# Patient Record
Sex: Male | Born: 1985 | Race: Black or African American | Hispanic: No | Marital: Single | Smoking: Former smoker
Health system: Southern US, Community
[De-identification: ages and names within clinical notes are randomized; demographics above are authoritative.]

## PROBLEM LIST (undated history)

## (undated) DIAGNOSIS — L0291 Cutaneous abscess, unspecified: Secondary | ICD-10-CM

## (undated) DIAGNOSIS — G35 Multiple sclerosis: Secondary | ICD-10-CM

## (undated) DIAGNOSIS — M1990 Unspecified osteoarthritis, unspecified site: Secondary | ICD-10-CM

## (undated) DIAGNOSIS — I10 Essential (primary) hypertension: Secondary | ICD-10-CM

## (undated) DIAGNOSIS — K219 Gastro-esophageal reflux disease without esophagitis: Secondary | ICD-10-CM

## (undated) DIAGNOSIS — R519 Headache, unspecified: Secondary | ICD-10-CM

## (undated) HISTORY — PX: APPENDECTOMY: SHX54

---

## 2013-04-13 ENCOUNTER — Inpatient Hospital Stay (HOSPITAL_COMMUNITY)
Admission: EM | Admit: 2013-04-13 | Discharge: 2013-04-15 | DRG: 349 | Disposition: A | Discharge status: Home / Self Care | Payer: MEDICAID | Attending: General Surgery | Admitting: General Surgery

## 2013-04-13 ENCOUNTER — Encounter (HOSPITAL_COMMUNITY): Admission: EM | Disposition: A | Discharge status: Home / Self Care | Payer: Self-pay | Source: Home / Self Care

## 2013-04-13 ENCOUNTER — Encounter (HOSPITAL_COMMUNITY): Payer: Self-pay | Admitting: Anesthesiology

## 2013-04-13 ENCOUNTER — Encounter (HOSPITAL_COMMUNITY): Payer: Self-pay | Admitting: *Deleted

## 2013-04-13 ENCOUNTER — Inpatient Hospital Stay (HOSPITAL_COMMUNITY): Payer: Self-pay | Admitting: Anesthesiology

## 2013-04-13 DIAGNOSIS — Z87891 Personal history of nicotine dependence: Secondary | ICD-10-CM

## 2013-04-13 DIAGNOSIS — N433 Hydrocele, unspecified: Secondary | ICD-10-CM | POA: Diagnosis present

## 2013-04-13 DIAGNOSIS — K612 Anorectal abscess: Principal | ICD-10-CM | POA: Diagnosis present

## 2013-04-13 DIAGNOSIS — L0291 Cutaneous abscess, unspecified: Secondary | ICD-10-CM

## 2013-04-13 DIAGNOSIS — K611 Rectal abscess: Secondary | ICD-10-CM

## 2013-04-13 HISTORY — PX: INCISION AND DRAINAGE PERIRECTAL ABSCESS: SHX1804

## 2013-04-13 HISTORY — DX: Cutaneous abscess, unspecified: L02.91

## 2013-04-13 LAB — BASIC METABOLIC PANEL
CO2: 24 mEq/L (ref 19–32)
Chloride: 105 mEq/L (ref 96–112)
Creatinine, Ser: 0.9 mg/dL (ref 0.50–1.35)

## 2013-04-13 LAB — CBC WITH DIFFERENTIAL/PLATELET
Basophils Absolute: 0.1 10*3/uL (ref 0.0–0.1)
HCT: 41.1 % (ref 39.0–52.0)
Hemoglobin: 14.1 g/dL (ref 13.0–17.0)
Lymphocytes Relative: 27 % (ref 12–46)
Lymphs Abs: 4 10*3/uL (ref 0.7–4.0)
Monocytes Absolute: 1.1 10*3/uL — ABNORMAL HIGH (ref 0.1–1.0)
Monocytes Relative: 7 % (ref 3–12)
Neutro Abs: 9.2 10*3/uL — ABNORMAL HIGH (ref 1.7–7.7)
RBC: 4.81 MIL/uL (ref 4.22–5.81)
RDW: 13.6 % (ref 11.5–15.5)
WBC: 14.9 10*3/uL — ABNORMAL HIGH (ref 4.0–10.5)

## 2013-04-13 SURGERY — INCISION AND DRAINAGE, ABSCESS, PERIRECTAL
Anesthesia: General | Site: Perineum | Wound class: Dirty or Infected

## 2013-04-13 MED ORDER — KCL IN DEXTROSE-NACL 20-5-0.45 MEQ/L-%-% IV SOLN
INTRAVENOUS | Status: DC
  Administered 2013-04-13: 15:00:00 via INTRAVENOUS
  Filled 2013-04-13 (×3): qty 1000

## 2013-04-13 MED ORDER — PIPERACILLIN-TAZOBACTAM 3.375 G IVPB
3.3750 g | Freq: Three times a day (TID) | INTRAVENOUS | Status: DC
  Administered 2013-04-13 – 2013-04-15 (×5): 3.375 g via INTRAVENOUS
  Filled 2013-04-13 (×8): qty 50

## 2013-04-13 MED ORDER — PROPOFOL 10 MG/ML IV BOLUS
INTRAVENOUS | Status: DC | PRN
  Administered 2013-04-13: 200 mg via INTRAVENOUS

## 2013-04-13 MED ORDER — FENTANYL CITRATE 0.05 MG/ML IJ SOLN
INTRAMUSCULAR | Status: DC | PRN
  Administered 2013-04-13: 50 ug via INTRAVENOUS
  Administered 2013-04-13 (×2): 100 ug via INTRAVENOUS

## 2013-04-13 MED ORDER — MIDAZOLAM HCL 5 MG/5ML IJ SOLN
INTRAMUSCULAR | Status: DC | PRN
  Administered 2013-04-13: 2 mg via INTRAVENOUS

## 2013-04-13 MED ORDER — DIPHENHYDRAMINE HCL 50 MG/ML IJ SOLN: 12.5000 mg | Freq: Four times a day (QID) | INTRAMUSCULAR | Status: DC | PRN

## 2013-04-13 MED ORDER — ACETAMINOPHEN 650 MG RE SUPP: 650.0000 mg | Freq: Four times a day (QID) | RECTAL | Status: DC | PRN

## 2013-04-13 MED ORDER — DIPHENHYDRAMINE HCL 12.5 MG/5ML PO ELIX: 12.5000 mg | ORAL_SOLUTION | Freq: Four times a day (QID) | ORAL | Status: DC | PRN

## 2013-04-13 MED ORDER — MORPHINE SULFATE 2 MG/ML IJ SOLN
1.0000 mg | INTRAMUSCULAR | Status: DC | PRN
  Administered 2013-04-13 – 2013-04-14 (×3): 2 mg via INTRAVENOUS
  Filled 2013-04-13 (×3): qty 1

## 2013-04-13 MED ORDER — LACTATED RINGERS IV SOLN
INTRAVENOUS | Status: DC | PRN
  Administered 2013-04-13: 17:00:00 via INTRAVENOUS

## 2013-04-13 MED ORDER — HYDROMORPHONE HCL PF 1 MG/ML IJ SOLN: 0.2500 mg | INTRAMUSCULAR | Status: DC | PRN

## 2013-04-13 MED ORDER — ACETAMINOPHEN 325 MG PO TABS: 650.0000 mg | ORAL_TABLET | Freq: Four times a day (QID) | ORAL | Status: DC | PRN

## 2013-04-13 MED ORDER — ONDANSETRON HCL 4 MG/2ML IJ SOLN
4.0000 mg | Freq: Four times a day (QID) | INTRAMUSCULAR | Status: DC | PRN
  Administered 2013-04-14 – 2013-04-15 (×2): 4 mg via INTRAVENOUS
  Filled 2013-04-13 (×3): qty 2

## 2013-04-13 MED ORDER — LIDOCAINE HCL (CARDIAC) 20 MG/ML IV SOLN
INTRAVENOUS | Status: DC | PRN
  Administered 2013-04-13: 100 mg via INTRAVENOUS

## 2013-04-13 MED ORDER — BUPIVACAINE HCL (PF) 0.25 % IJ SOLN
INTRAMUSCULAR | Status: DC | PRN
  Administered 2013-04-13: 20 mL

## 2013-04-13 MED ORDER — OXYCODONE-ACETAMINOPHEN 5-325 MG PO TABS
1.0000 | ORAL_TABLET | ORAL | Status: DC | PRN
  Administered 2013-04-14 – 2013-04-15 (×3): 2 via ORAL
  Filled 2013-04-13 (×3): qty 2

## 2013-04-13 MED ORDER — LACTATED RINGERS IV SOLN
INTRAVENOUS | Status: DC
  Administered 2013-04-13: 15:00:00 via INTRAVENOUS
  Administered 2013-04-14: 10 mL/h via INTRAVENOUS

## 2013-04-13 MED ORDER — ONDANSETRON HCL 4 MG/2ML IJ SOLN
INTRAMUSCULAR | Status: DC | PRN
  Administered 2013-04-13: 4 mg via INTRAVENOUS

## 2013-04-13 SURGICAL SUPPLY — 33 items
BLADE SURG 15 STRL LF DISP TIS (BLADE) ×1 IMPLANT
BLADE SURG 15 STRL SS (BLADE) ×1
CANISTER SUCTION 2500CC (MISCELLANEOUS) ×2 IMPLANT
CLEANER TIP ELECTROSURG 2X2 (MISCELLANEOUS) IMPLANT
CLOTH BEACON ORANGE TIMEOUT ST (SAFETY) ×2 IMPLANT
COVER SURGICAL LIGHT HANDLE (MISCELLANEOUS) ×2 IMPLANT
DRAPE UTILITY 15X26 W/TAPE STR (DRAPE) ×4 IMPLANT
DRSG PAD ABDOMINAL 8X10 ST (GAUZE/BANDAGES/DRESSINGS) ×2 IMPLANT
ELECT REM PT RETURN 9FT ADLT (ELECTROSURGICAL) ×2
ELECTRODE REM PT RTRN 9FT ADLT (ELECTROSURGICAL) ×1 IMPLANT
GAUZE PACKING IODOFORM 1 (PACKING) IMPLANT
GAUZE PACKING IODOFORM 1/2 (PACKING) ×2 IMPLANT
GAUZE SPONGE 4X4 16PLY XRAY LF (GAUZE/BANDAGES/DRESSINGS) ×2 IMPLANT
GLOVE BIOGEL M STRL SZ7.5 (GLOVE) ×2 IMPLANT
GLOVE BIOGEL PI IND STRL 8 (GLOVE) ×2 IMPLANT
GLOVE BIOGEL PI INDICATOR 8 (GLOVE) ×2
GOWN STRL NON-REIN LRG LVL3 (GOWN DISPOSABLE) ×2 IMPLANT
GOWN STRL REIN XL XLG (GOWN DISPOSABLE) ×2 IMPLANT
KIT BASIN OR (CUSTOM PROCEDURE TRAY) ×2 IMPLANT
KIT ROOM TURNOVER OR (KITS) ×4 IMPLANT
NS IRRIG 1000ML POUR BTL (IV SOLUTION) ×2 IMPLANT
PACK LITHOTOMY IV (CUSTOM PROCEDURE TRAY) ×2 IMPLANT
PAD ARMBOARD 7.5X6 YLW CONV (MISCELLANEOUS) ×4 IMPLANT
PENCIL BUTTON HOLSTER BLD 10FT (ELECTRODE) ×2 IMPLANT
SPONGE GAUZE 4X4 12PLY (GAUZE/BANDAGES/DRESSINGS) ×2 IMPLANT
SWAB COLLECTION DEVICE MRSA (MISCELLANEOUS) ×2 IMPLANT
TOWEL OR 17X24 6PK STRL BLUE (TOWEL DISPOSABLE) ×2 IMPLANT
TOWEL OR 17X26 10 PK STRL BLUE (TOWEL DISPOSABLE) ×2 IMPLANT
TUBE ANAEROBIC SPECIMEN COL (MISCELLANEOUS) ×2 IMPLANT
TUBE CONNECTING 12X1/4 (SUCTIONS) ×2 IMPLANT
UNDERPAD 30X30 INCONTINENT (UNDERPADS AND DIAPERS) ×2 IMPLANT
WATER STERILE IRR 1000ML POUR (IV SOLUTION) ×2 IMPLANT
YANKAUER SUCT BULB TIP NO VENT (SUCTIONS) ×2 IMPLANT

## 2013-04-13 NOTE — H&P (Signed)
I saw the patient, participated in the history, exam and medical decision making, and concur with the physician assistant's note above.  Discussed risk & benefits of surgery with pt - bleeding, ongoing infection, need for addl procedures, blood clot formation, anesthesia complications, formation of fistula, urinary retention. Recurrence. Scarring.   Pt agrees with proceeding to OR Mary Sella. Andrey Campanile, MD, FACS General, Bariatric, & Minimally Invasive Surgery [facility], [place]

## 2013-04-13 NOTE — Anesthesia Preprocedure Evaluation (Signed)
Anesthesia Evaluation  Patient identified by MRN, date of birth, ID band Patient awake    Reviewed: Allergy & Precautions, H&P , NPO status , Patient's Chart, lab work & pertinent test results  Airway       Dental   Pulmonary neg pulmonary ROS,          Cardiovascular negative cardio ROS  Rhythm:Regular Rate:Normal     Neuro/Psych    GI/Hepatic Neg liver ROS,   Endo/Other    Renal/GU negative Renal ROS     Musculoskeletal   Abdominal   Peds  Hematology   Anesthesia Other Findings   Reproductive/Obstetrics                           Anesthesia Physical Anesthesia Plan  ASA: I  Anesthesia Plan: General   Post-op Pain Management:    Induction: Intravenous  Airway Management Planned: Oral ETT  Additional Equipment:   Intra-op Plan:   Post-operative Plan: Extubation in OR  Informed Consent: I have reviewed the patients History and Physical, chart, labs and discussed the procedure including the risks, benefits and alternatives for the proposed anesthesia with the patient or authorized representative who has indicated his/her understanding and acceptance.   Dental advisory given  Plan Discussed with: CRNA, Anesthesiologist and Surgeon  Anesthesia Plan Comments:         Anesthesia Quick Evaluation

## 2013-04-13 NOTE — Anesthesia Procedure Notes (Signed)
Procedure Name: LMA Insertion Date/Time: 04/13/2013 5:11 PM Performed by: Coralee Rud Pre-anesthesia Checklist: Patient identified, Emergency Drugs available and Suction available Patient Re-evaluated:Patient Re-evaluated prior to inductionOxygen Delivery Method: Circle system utilized Preoxygenation: Pre-oxygenation with 100% oxygen Intubation Type: IV induction Ventilation: Mask ventilation without difficulty LMA: LMA inserted LMA Size: 5.0 Number of attempts: 1 Placement Confirmation: positive ETCO2 Tube secured with: Tape

## 2013-04-13 NOTE — Op Note (Signed)
Pre Operative Diagnosis:  Perirectal abscess  Post Operative Diagnosis: same  Procedure: exam under anesthesia, I&D of left perirectal abscess  Surgeon: Dr. Axel Filler  Assistant: none  Anesthesia: GETA  EBL: 10 cc  Complications: none  Counts: reported as correct x 2  Findings:  Approximately 2 x 4 cm area of abscess. All loculations were broken up. Cultures were sent. The area was shallow and packed with half-inch gauze.  Indications for procedure:  The patient is a 27 year old male with a several day history of left perineal abscess. Patient was evaluated to have a perirectal abscess. Patient was consented for I&D of perirectal abscess drain placement.  Details of the procedure:The patient was taken back to the operating room. The patient was placed in lithotomy position with bilateral SCDs in place. After appropriate anitbiotics were confirmed, a time-out was confirmed and all facts were verified.  I stab incision was made of the area of greatest fluctuance. A large amount of purulence was expressed. Both anaerobic and aerobic cultures were sent. An elliptical incision was made which unroofed the abscess cavity. All loculations were broken up with blunt dissection. The area was then checked for hemostasis which was achieved using Bovie cautery. The area was irrigated out with sterile saline. At this point performed an exam under anesthesia there was no overt fistula that I could visualize. The abscess cavity was then packed with half-inch iodoform gauze. The area was injected with quarter percent Marcaine with epi. The wounds and dressed with 4 x 4's ABD pads and mesh panties.The patient was taken to the recovery room in stable condition.

## 2013-04-13 NOTE — ED Notes (Signed)
Pt states that he has an abscess behind scrotum. Pt states that he has hx of same. Pt state that he is normally able to use "some cream" and they go away.

## 2013-04-13 NOTE — ED Provider Notes (Signed)
History     CSN: [id number]  Arrival date & time 04/13/13  9562   First MD Initiated Contact with Patient 04/13/13 [phone]      Chief Complaint  Patient presents with  . Abscess    (Consider location/radiation/quality/duration/timing/severity/associated sxs/prior treatment) HPI Comments: Patient is a 27 year old male who presents with pain between his scrotum and rectum which he describes as sharp that has been gradually worsening for the past week. He first noticed the pain while jogging. It radiates up his groin. Worse with palpation. Sometimes he has pain with BMs. He has had prior abscesses before, but never in this area. Advil was helping, but the pain has gradually worsened and it is no longer helping. He was diagnosed with a cyst in his scrotum which was worked up for malignancy. No fevers, chills, urinary symptoms, IVDA.   Patient is a 27 y.o. male presenting with abscess. The history is provided by the patient. No language interpreter was used.  Abscess Associated symptoms: no fever, no nausea and no vomiting     History reviewed. No pertinent past medical history.  Past Surgical History  Procedure Laterality Date  . Appendectomy      No family history on file.  History  Substance Use Topics  . Smoking status: Never Smoker   . Smokeless tobacco: Not on file  . Alcohol Use: No      Review of Systems  Constitutional: Negative for fever and chills.  Gastrointestinal: Negative for nausea, vomiting and abdominal pain.  Genitourinary: Negative for dysuria, urgency, frequency and difficulty urinating.  Musculoskeletal: Positive for myalgias.  Skin: Positive for wound.  All other systems reviewed and are negative.    Allergies  Review of patient's allergies indicates no known allergies.  Home Medications  No current outpatient prescriptions on file.  BP 134/91  Pulse 104  Temp(Src) 98.1 F (36.7 C) (Oral)  Resp 16  SpO2 99%  Physical Exam  Nursing note  and vitals reviewed. Constitutional: He is oriented to person, place, and time. He appears well-developed and well-nourished. No distress.  HENT:  Head: Normocephalic and atraumatic.  Right Ear: External ear normal.  Left Ear: External ear normal.  Nose: Nose normal.  Eyes: Conjunctivae are normal.  Neck: Normal range of motion. No tracheal deviation present.  Cardiovascular: Normal rate, regular rhythm and normal heart sounds.   Pulmonary/Chest: Effort normal and breath sounds normal. No stridor.  Abdominal: Soft. He exhibits no distension. There is no tenderness.  Musculoskeletal: Normal range of motion.  Neurological: He is alert and oriented to person, place, and time.  Skin: Skin is warm and dry. He is not diaphoretic.  Area of induration with streaking or erythema between scrotum and rectum. US performed, small, deep abscess  Psychiatric: He has a normal mood and affect. His behavior is normal.    ED Course  Procedures (including critical care time)  Labs Reviewed  CBC WITH DIFFERENTIAL - Abnormal; Notable for the following:    WBC 14.9 (*)    Neutro Abs 9.2 (*)    Monocytes Absolute 1.1 (*)    All other components within normal limits  BASIC METABOLIC PANEL  BASIC METABOLIC PANEL  CBC   No results found.   1. Perirectal abscess       MDM  Patient is a 27 year old male who presents today with a perirectal abscess. Surgery was consulted and agreed to take him to OR to drain the abscess. Vital signs stable for transfer. Dr. Ignacia Palma  evaluated patient and agrees with plan. Discussed plan with patient who agrees.        Mora Bellman, PA-C 04/13/13 1311

## 2013-04-13 NOTE — Transfer of Care (Signed)
Immediate Anesthesia Transfer of Care Note  Patient: Devin Marsh  Procedure(s) Performed: Procedure(s): IRRIGATION AND DEBRIDEMENT PERIRECTAL ABSCESS (N/A)  Patient Location: PACU  Anesthesia Type:General  Level of Consciousness: awake, sedated and patient cooperative  Airway & Oxygen Therapy: Patient Spontanous Breathing and Patient connected to nasal cannula oxygen  Post-op Assessment: Report given to PACU RN, Post -op Vital signs reviewed and stable and Patient moving all extremities  Post vital signs: Reviewed and stable  Complications: No apparent anesthesia complications

## 2013-04-13 NOTE — H&P (Signed)
Devin Marsh is an 27 y.o. male.   Chief Complaint: Painful cyst below scrotum and rectum HPI: healthy 27 y/o male who noted an the above last week after jogging.  He says it has become progressively worse.  His girlfriend tried some ice without any success.  He presents to the ER now with pain below scrotum and left perirectal fluid collection.  History reviewed. No pertinent past medical history. Seasonal allergies.  Past Surgical History  Procedure Laterality Date  . Appendectomy      FH:  Both parents, 1 brother and 2 sisters in good health Social History:  Tobacco: smoked 3-4 year, quit at age 26  Drugs: none  ETOH: none Single works at [employer].   Allergies: No Known Allergies Prior to Admission medications   Medication Sig Start Date End Date Taking? Authorizing Provider  cetirizine (ZYRTEC) 10 MG tablet Take 10 mg by mouth daily as needed for allergies.   Yes Historical Provider, MD     (Not in a hospital admission)  No results found for this or any previous visit (from the past 48 hour(s)). No results found.  Review of Systems  Constitutional: Negative.   HENT: Positive for congestion (with allergies takes Zyrtec.).   Eyes: Negative.   Respiratory: Negative.   Cardiovascular: Negative.   Gastrointestinal: Positive for heartburn (some), constipation (occasional) and blood in stool (occasional with with BM). Negative for nausea, vomiting and abdominal pain.  Genitourinary: Negative.        He has a small right hydrocele seen in [facility] for this in the past.  Musculoskeletal: Negative.   Skin: Negative for itching and rash.       Some zits like changes in the past none currently  Neurological: Negative.   Endo/Heme/Allergies: Negative.   Psychiatric/Behavioral: Negative.     Blood pressure 134/91, pulse 104, temperature 98.1 F (36.7 C), temperature source Oral, resp. rate 16, SpO2 99.00%. Physical Exam  Constitutional: He is  oriented to person, place, and time. He appears well-developed and well-nourished. No distress.  HENT:  Head: Normocephalic and atraumatic.  Nose: Nose normal.  Eyes: Conjunctivae and EOM are normal. Pupils are equal, round, and reactive to light. Right eye exhibits no discharge. Left eye exhibits no discharge. No scleral icterus.  Neck: Normal range of motion. Neck supple. No JVD present. No tracheal deviation present. No thyromegaly present.  Cardiovascular: Normal rate, regular rhythm and intact distal pulses.   Murmur heard. Respiratory: Effort normal and breath sounds normal. No respiratory distress. He has no wheezes. He has no rales. He exhibits no tenderness.  GI: Soft. Bowel sounds are normal. He exhibits no distension and no mass. There is no tenderness. There is no rebound and no guarding.  Genitourinary: Penis normal.  6 cm left gluteal area that is tender and fluctulent. Not really red. Small right hydrocele.  Musculoskeletal: He exhibits no edema and no tenderness.  Lymphadenopathy:    He has no cervical adenopathy.  Neurological: He is alert and oriented to person, place, and time. No cranial nerve deficit.  Skin: Skin is warm and dry. No rash noted. He is not diaphoretic. No erythema. No pallor.  Psychiatric: He has a normal mood and affect. His behavior is normal. Judgment and thought content normal.     Assessment/Plan 1. Perirectal abscess left buttocks 2. Hydrocele.  Plan:  Labs WBC 14.9.  We plan to take to the OR later this afternoon.  Starting IV fluids and Zosyn. Sherrie George  04/13/2013, 11:12 AM

## 2013-04-14 ENCOUNTER — Encounter (HOSPITAL_COMMUNITY): Payer: Self-pay | Admitting: General Surgery

## 2013-04-14 MED ORDER — ENOXAPARIN SODIUM 40 MG/0.4ML ~~LOC~~ SOLN
40.0000 mg | SUBCUTANEOUS | Status: DC
  Administered 2013-04-14: 40 mg via SUBCUTANEOUS
  Filled 2013-04-14: qty 0.4

## 2013-04-14 NOTE — Progress Notes (Signed)
Discussed with PA  Manami Tutor M. Tequisha Maahs, MD, FACS General, Bariatric, & Minimally Invasive Surgery [facility] [place]  

## 2013-04-14 NOTE — Progress Notes (Signed)
1 Day Post-Op  Subjective: Pt doing okay, not much pain.  Has felt chills and sweats overnight, now nauseated.  No vomiting.  Poor appetite, significant other brought hardes, suggested something a bit lighter/BRAT diet.  He is ambulating well.  No difficulties with voiding.  No abdominal pain, +flatus, no bm since prior to admission.  Denies shortness of breath or chest pains.    Objective: Vital signs in last 24 hours: Temp:  [97.5 F (36.4 C)-98.6 F (37 C)] 98.2 F (36.8 C) (06/17 0525) Pulse Rate:  [71-83] 77 (06/17 0525) Resp:  [13-23] 18 (06/17 0525) BP: (107-134)/(68-90) 107/69 mmHg (06/17 0525) SpO2:  [95 %-100 %] 100 % (06/17 0525) FiO2 (%):  [28 %] 28 % (06/16 1809)    Intake/Output from previous day: 06/16 0701 - 06/17 0700 In: 800 [I.V.:800] Out: 600 [Urine:600] Intake/Output this shift:    General appearance: alert, cooperative, appears stated age and no distress Resp: clear to auscultation bilaterally Cardio: regular rate and rhythm, S1, S2 normal, no murmur, click, rub or gallop GI: soft, non-tender; bowel sounds normal; no masses,  no organomegaly  Lab Results:   Recent Labs  04/13/13 1135  WBC 14.9*  HGB 14.1  HCT 41.1  PLT 292   BMET  Recent Labs  04/13/13 1135  NA 140  K 3.9  CL 105  CO2 24  GLUCOSE 88  BUN 11  CREATININE 0.90  CALCIUM 9.1    Studies/Results: No results found.  Anti-infectives: Anti-infectives   Start     Dose/Rate Route Frequency Ordered Stop   04/13/13 1400  piperacillin-tazobactam (ZOSYN) IVPB 3.375 g     3.375 g 12.5 mL/hr over 240 Minutes Intravenous 3 times per day 04/13/13 1219        Assessment/Plan: s/p Procedure(s): IRRIGATION AND DEBRIDEMENT PERIRECTAL ABSCESS (N/A) Dr. Derrell Lolling 04/13/13 Perirectal abscess -Culture is pending -BID wet to dry dressing changes.  Will premedicate patient today and change dressing.  Hopefully discharge tomorrow with home health.  His significant other is a nurse and will  be able to do the dressings. -zosyn  VTE prophylaxis  -add lovenox, ambulate and SCDs    LOS: 1 day    Jocelin Schuelke ANP-BC  04/14/2013 9:56 AM

## 2013-04-14 NOTE — Anesthesia Postprocedure Evaluation (Signed)
  Anesthesia Post-op Note  Patient: Devin Marsh  Procedure(s) Performed: Procedure(s): IRRIGATION AND DEBRIDEMENT PERIRECTAL ABSCESS (N/A)  Patient Location: PACU  Anesthesia Type:General  Level of Consciousness: awake, alert , oriented and patient cooperative  Airway and Oxygen Therapy: Patient Spontanous Breathing  Post-op Pain: mild  Post-op Assessment: Post-op Vital signs reviewed, Patient's Cardiovascular Status Stable, Respiratory Function Stable, Patent Airway, No signs of Nausea or vomiting and Pain level controlled  Post-op Vital Signs: stable  Complications: No apparent anesthesia complications

## 2013-04-14 NOTE — ED Provider Notes (Signed)
Medical screening examination/treatment/procedure(s) were performed by non-physician practitioner and as supervising physician I was immediately available for consultation/collaboration. Pt has pain in left perineum just anterior to rectum.  Screening ultrasound shows deep fluid collection.  Recommended general surgical evaluation for perirectal abscess.  Carleene Cooper III, MD 04/14/13 [phone]

## 2013-04-15 ENCOUNTER — Telehealth (INDEPENDENT_AMBULATORY_CARE_PROVIDER_SITE_OTHER): Payer: Self-pay | Admitting: *Deleted

## 2013-04-15 MED ORDER — OXYCODONE-ACETAMINOPHEN 5-325 MG PO TABS: 1.0000 | ORAL_TABLET | Freq: Four times a day (QID) | ORAL | Status: DC | PRN

## 2013-04-15 MED ORDER — AMOXICILLIN-POT CLAVULANATE 875-125 MG PO TABS: 1.0000 | ORAL_TABLET | Freq: Two times a day (BID) | ORAL | Status: DC

## 2013-04-15 MED ORDER — ACETAMINOPHEN 325 MG PO TABS: 650.0000 mg | ORAL_TABLET | Freq: Four times a day (QID) | ORAL | Status: DC | PRN

## 2013-04-15 NOTE — Discharge Summary (Signed)
Physician Discharge Summary  Siyon Linck YNW:[id number] DOB: 1986-04-17 DOA: 04/13/2013  Consultation: none  Admit date: 04/13/2013 Discharge date: 04/15/2013  Recommendations for Outpatient Follow-up:    Follow-up Information   Follow up with Lajean Saver, MD. Schedule an appointment as soon as possible for a visit in 2 weeks. (please call as soon as possible to schedule a follow up appt with your surgeon.)    Contact information:   [address] [place] [address] [phone]      Discharge Diagnoses:  1. Perirectal abscess   Surgical Procedure: IRRIGATION AND DEBRIDEMENT PERIRECTAL ABSCESS(Dr. Derrell Lolling 04/13/13)  Discharge Condition: stable Disposition: home  Diet recommendation: regular, high fiber  Filed Weights   04/14/13 1056  Weight: 227 lb 3.2 oz (103.057 kg)       Hospital Course:  Devin Marsh is a healthy male who presented with a perirectal abscess.  He underwent an incision and debridement.  He had an uneventful post operative stay.  He was treated with IV antibiotics.  His vital signs remained stable along with laboratory testing.  On POD #2 his pain was under good control, he was ambulating and tolerating regular diet.  He was therefore felt safe for discharge. I offered home health for dressing changes, however, he does not have health insurance.  His girlfriend is a Engineer, civil (consulting) and is able to do the BID dressing changes, nurses teaching completed.  He was given a prescription for an additional week of antibiotics along with pain medication.  We discussed side effects of the medication and signs and symptoms that warrant immediate attention  General appearance: alert and oriented. Calm and cooperative No acute distress. VSS. Afebrile.  Resp: clear to auscultation bilaterally  Cardio: S1S1 RRR without murmurs or gallops. No edema. GI: soft round and nontender. +BS x4 quadrants. No organomegaly, hernias or masses. Perirectal dressing is dry and  intact. Pulses: +2 bilateral distal pulses without cyanosis   Discharge Instructions     Medication List    TAKE these medications       acetaminophen 325 MG tablet  Commonly known as:  TYLENOL  Take 2 tablets (650 mg total) by mouth every 6 (six) hours as needed.     amoxicillin-clavulanate 875-125 MG per tablet  Commonly known as:  AUGMENTIN  Take 1 tablet by mouth 2 (two) times daily.     cetirizine 10 MG tablet  Commonly known as:  ZYRTEC  Take 10 mg by mouth daily as needed for allergies.     oxyCODONE-acetaminophen 5-325 MG per tablet  Commonly known as:  PERCOCET/ROXICET  Take 1 tablet by mouth every 6 (six) hours as needed.           Follow-up Information   Follow up with Lajean Saver, MD. Schedule an appointment as soon as possible for a visit in 2 weeks. (please call as soon as possible to schedule a follow up appt with your surgeon.)    Contact information:   [address] [place] [address] [phone]        The results of significant diagnostics from this hospitalization (including imaging, microbiology, ancillary and laboratory) are listed below for reference.    Significant Diagnostic Studies: No results found.  Microbiology: Recent Results (from the past 240 hour(s))  ANAEROBIC CULTURE     Status: None   Collection Time    04/13/13  5:39 PM      Result Value Range Status   Specimen Description ABSCESS ANAL  Final   Special Requests NONE   Final   Gram Stain     Final   Value: ABUNDANT WBC PRESENT, PREDOMINANTLY PMN     NO SQUAMOUS EPITHELIAL CELLS SEEN     ABUNDANT GRAM POSITIVE COCCI     IN PAIRS IN CLUSTERS   Culture     Final   Value: NO ANAEROBES ISOLATED; CULTURE IN PROGRESS FOR 5 DAYS   Report Status PENDING   Incomplete  CULTURE, ROUTINE-ABSCESS     Status: None   Collection Time    04/13/13  5:39 PM      Result Value Range Status   Specimen Description ABSCESS ANAL   Final   Special Requests NONE   Final    Gram Stain     Final   Value: ANAEROBES     WBC PRESENT, PREDOMINANTLY PMN     NO SQUAMOUS EPITHELIAL CELLS SEEN     MODERATE GRAM POSITIVE COCCI   Culture NO GROWTH 2 DAYS   Final   Report Status PENDING   Incomplete     Labs: Basic Metabolic Panel:  Recent Labs Lab 04/13/13 1135  NA 140  K 3.9  CL 105  CO2 24  GLUCOSE 88  BUN 11  CREATININE 0.90  CALCIUM 9.1   CBC:  Recent Labs Lab 04/13/13 1135  WBC 14.9*  NEUTROABS 9.2*  HGB 14.1  HCT 41.1  MCV 85.4  PLT 292    Active Problems:   * No active hospital problems. *   Time coordinating discharge: 30 mins  Signed:  Waddell Iten, ANP-BC

## 2013-04-15 NOTE — Telephone Encounter (Signed)
Patient called to state increase in bleeding from the site.  Patient states he has been getting up a lot more and moving around.  It sounds as though there is increased swelling to the site.  Patient encouraged to use some ice to help with swelling and bleeding.  Also encouraged patient to take some rest periods to decrease bleeding due to gravity.  Patient states understanding and will call tomorrow if bleeding has not improved.

## 2013-04-17 LAB — CULTURE, ROUTINE-ABSCESS

## 2013-04-18 LAB — ANAEROBIC CULTURE

## 2013-04-20 NOTE — Discharge Summary (Signed)
Fey Coghill M. Christianne Zacher, MD, FACS General, Bariatric, & Minimally Invasive Surgery [facility]  

## 2013-05-06 ENCOUNTER — Ambulatory Visit (INDEPENDENT_AMBULATORY_CARE_PROVIDER_SITE_OTHER): Payer: Self-pay | Admitting: General Surgery

## 2013-05-06 ENCOUNTER — Encounter (INDEPENDENT_AMBULATORY_CARE_PROVIDER_SITE_OTHER): Payer: Self-pay | Admitting: General Surgery

## 2013-05-06 VITALS — BP 112/72 | HR 72 | Temp 98.2°F | Resp 16 | Ht 70.0 in | Wt 236.0 lb

## 2013-05-06 DIAGNOSIS — L03317 Cellulitis of buttock: Secondary | ICD-10-CM

## 2013-05-06 DIAGNOSIS — L0231 Cutaneous abscess of buttock: Secondary | ICD-10-CM

## 2013-05-06 NOTE — Progress Notes (Signed)
Patient ID: Devin Marsh, male   DOB: 1986/10/12, 27 y.o.   MRN: [id number] The patient is a 27 year old male status post left gluteal I&D. Patient has been doing well postoperatively and after being sent home from the hospital.antibiotics have been completed. Wound dressing changes have been going well without any issues.  On exam: Flat beefy-red wound no drainage no erythema  Assessment and plan: 27 year old male status post I&D of left abscess. 1. Patient return to clinic when necessary 2.  No further dressing changes needed

## 2015-02-28 ENCOUNTER — Encounter (HOSPITAL_COMMUNITY): Payer: Self-pay | Admitting: *Deleted

## 2015-02-28 ENCOUNTER — Emergency Department (HOSPITAL_COMMUNITY)
Admission: EM | Admit: 2015-02-28 | Discharge: 2015-02-28 | Disposition: A | Discharge status: Home / Self Care | Payer: Self-pay | Attending: Emergency Medicine | Admitting: Emergency Medicine

## 2015-02-28 ENCOUNTER — Emergency Department (HOSPITAL_COMMUNITY): Payer: Self-pay

## 2015-02-28 DIAGNOSIS — Z872 Personal history of diseases of the skin and subcutaneous tissue: Secondary | ICD-10-CM | POA: Insufficient documentation

## 2015-02-28 DIAGNOSIS — Z720 Tobacco use: Secondary | ICD-10-CM | POA: Insufficient documentation

## 2015-02-28 DIAGNOSIS — R0789 Other chest pain: Secondary | ICD-10-CM | POA: Insufficient documentation

## 2015-02-28 DIAGNOSIS — E669 Obesity, unspecified: Secondary | ICD-10-CM | POA: Insufficient documentation

## 2015-02-28 DIAGNOSIS — Z79899 Other long term (current) drug therapy: Secondary | ICD-10-CM | POA: Insufficient documentation

## 2015-02-28 LAB — COMPREHENSIVE METABOLIC PANEL
ALT: 41 U/L (ref 17–63)
ANION GAP: 10 (ref 5–15)
AST: 29 U/L (ref 15–41)
Albumin: 4 g/dL (ref 3.5–5.0)
Alkaline Phosphatase: 81 U/L (ref 38–126)
BILIRUBIN TOTAL: 0.5 mg/dL (ref 0.3–1.2)
BUN: 9 mg/dL (ref 6–20)
CO2: 25 mmol/L (ref 22–32)
CREATININE: 1.05 mg/dL (ref 0.61–1.24)
Calcium: 9.3 mg/dL (ref 8.9–10.3)
Chloride: 107 mmol/L (ref 101–111)
GFR calc Af Amer: >60 mL/min (ref >60)
GFR calc non Af Amer: >60 mL/min (ref >60)
Glucose, Bld: 127 mg/dL — ABNORMAL HIGH (ref 70–99)
POTASSIUM: 3.8 mmol/L (ref 3.5–5.1)
Sodium: 142 mmol/L (ref 135–145)
TOTAL PROTEIN: 7.1 g/dL (ref 6.5–8.1)

## 2015-02-28 LAB — CBC WITH DIFFERENTIAL/PLATELET
BASOS PCT: 1 % (ref 0–1)
Basophils Absolute: 0.1 10*3/uL (ref 0.0–0.1)
EOS PCT: 5 % (ref 0–5)
Eosinophils Absolute: 0.4 10*3/uL (ref 0.0–0.7)
HCT: 41.2 % (ref 39.0–52.0)
HEMOGLOBIN: 13.2 g/dL (ref 13.0–17.0)
LYMPHS ABS: 4.1 10*3/uL — AB (ref 0.7–4.0)
Lymphocytes Relative: 43 % (ref 12–46)
MCH: 27.6 pg (ref 26.0–34.0)
MCHC: 32 g/dL (ref 30.0–36.0)
MCV: 86 fL (ref 78.0–100.0)
Monocytes Absolute: 0.4 10*3/uL (ref 0.1–1.0)
Monocytes Relative: 4 % (ref 3–12)
NEUTROS ABS: 4.5 10*3/uL (ref 1.7–7.7)
Neutrophils Relative %: 47 % (ref 43–77)
Platelets: 273 10*3/uL (ref 150–400)
RBC: 4.79 MIL/uL (ref 4.22–5.81)
RDW: 14 % (ref 11.5–15.5)
WBC: 9.5 10*3/uL (ref 4.0–10.5)

## 2015-02-28 MED ORDER — OXYCODONE-ACETAMINOPHEN 5-325 MG PO TABS
1.0000 | ORAL_TABLET | ORAL | Status: DC | PRN
Start: 2015-02-28 — End: 2015-05-12

## 2015-02-28 NOTE — ED Provider Notes (Signed)
CSN: [id number]     Arrival date & time 02/28/15  1217 History   First MD Initiated Contact with Patient 02/28/15 1634     Chief Complaint  Patient presents with  . Chest Pain    rib pain  . Cough     (Consider location/radiation/quality/duration/timing/severity/associated sxs/prior Treatment) HPI  this is a 29 year old male who presents today complaining of left lateral chest pain that is worse with movement and coughing. He states it began yesterday with coughing. He has had seasonal allergy symptoms. He denies wheezing or fever. He states that his girlfriend is a nurse that he coughed up some dark material that she thought might be blood he came in secondary to this area he has taken Tylenol and ibuprofen without relief. He is a current smoker although he did quit for 3 years recently. He denies any history of DVT, DVT risk factors, or symptoms such as leg swelling. He describes the pain as sharp and severe. It is nonradiating in nature. He denies any similar previous symptoms.  Past Medical History  Diagnosis Date  . Abscess 04/13/2013   Past Surgical History  Procedure Laterality Date  . Appendectomy    . Incision and drainage perirectal abscess N/A 04/13/2013    Procedure: IRRIGATION AND DEBRIDEMENT PERIRECTAL ABSCESS;  Surgeon: [name], MD;  Location: [place];  Service: General;  Laterality: N/A;   No family history on file. History  Substance Use Topics  . Smoking status: Current Every Day Smoker    Last Attempt to Quit: 10/29/2008  . Smokeless tobacco: Never Used  . Alcohol Use: No    Review of Systems  All other systems reviewed and are negative.     Allergies  Review of patient's allergies indicates no known allergies.  Home Medications   Prior to Admission medications   Medication Sig Start Date End Date Taking? Authorizing Provider  cetirizine (ZYRTEC) 10 MG tablet Take 10 mg by mouth daily as needed for allergies.    Historical Provider, MD   BP 127/94  mmHg  Pulse 88  Temp(Src) 98.5 F (36.9 C) (Oral)  Resp 16  Ht 5\' 11"  (1.803 m)  Wt 260 lb (117.935 kg)  BMI 36.28 kg/m2  SpO2 98% Physical Exam  Constitutional: He is oriented to person, place, and time. He appears well-developed and well-nourished.  Obese  HENT:  Head: Normocephalic and atraumatic.  Right Ear: External ear normal.  Left Ear: External ear normal.  Nose: Nose normal.  Mouth/Throat: Oropharynx is clear and moist.  Eyes: Conjunctivae and EOM are normal. Pupils are equal, round, and reactive to light.  Neck: Normal range of motion. Neck supple.  Cardiovascular: Normal rate, regular rhythm, normal heart sounds and intact distal pulses.   Pulmonary/Chest: Effort normal and breath sounds normal. No respiratory distress. He has no wheezes.   He exhibits tenderness.  Abdominal: Soft. Bowel sounds are normal. He exhibits no distension and no mass. There is no tenderness. There is no guarding.  Musculoskeletal: Normal range of motion.  Neurological: He is alert and oriented to person, place, and time. He has normal reflexes. He exhibits normal muscle tone. Coordination normal.  Skin: Skin is warm and dry.  Psychiatric: He has a normal mood and affect. His behavior is normal. Judgment and thought content normal.  Nursing note and vitals reviewed.   ED Course  Procedures (including critical care time) Labs Review Labs Reviewed  CBC WITH DIFFERENTIAL/PLATELET - Abnormal; Notable for the following:    Lymphs  Abs 4.1 (*)    All other components within normal limits  COMPREHENSIVE METABOLIC PANEL - Abnormal; Notable for the following:    Glucose, Bld 127 (*)    All other components within normal limits    Imaging Review Dg Ribs Unilateral W/chest Left  02/28/2015   CLINICAL DATA:  Left-sided rib pain stated last night.  Hemoptysis.  EXAM: LEFT RIBS AND CHEST - 3+ VIEW  COMPARISON:  None.  FINDINGS: Frontal view of the chest and four views of left-sided ribs. Midline  trachea. Heart size accentuated by low lung volumes on the frontal film. No pleural effusion or pneumothorax. Biapical pleural thickening. Clear lungs.  Left-sided rib films demonstrate no displaced fracture. Radiographic marker projects over the eleventh and twelfth lateral left ribs.  IMPRESSION: No displaced rib fracture, pleural fluid, or pneumothorax.   Electronically Signed   By: Abigail Miyamoto M.D.   On: 02/28/2015 14:07     EKG Interpretation   Date/Time:  Monday Feb 28 2015 13:06:45 EDT Ventricular Rate:  89 PR Interval:  164 QRS Duration: 82 QT Interval:  342 QTC Calculation: 416 R Axis:   26 Text Interpretation:  Normal sinus rhythm Confirmed by Dulse Rutan MD, Andee Poles  [phone]) on 02/28/2015 5:02:06 PM      MDM   Final diagnoses:  Left-sided chest wall pain    29 year old male with reproducible left chest pain without dyspnea or fever. I have a low index of suspicion for coronary artery disease or pulmonary embolism. The pain is very reproducible and there is point tenderness. I have discussed these other conditions with the patient and discussed return precautions such as dyspnea change in type pain. He has asked for a work note and I will give him a work note to go back on Wednesday. He will be given a prescription for 10 Percocet.    Pattricia Boss, MD 03/02/15 [phone]

## 2015-02-28 NOTE — ED Notes (Signed)
Pt denies pain at this time. States that it only hurts when coughing.

## 2015-02-28 NOTE — ED Notes (Signed)
Pt states pain to L rib cage since Sunday.  Pain increases with palpation, movement and breathing.  Denies sob, but c/o occasional productive cough.

## 2015-02-28 NOTE — Discharge Instructions (Signed)
Chest Wall Pain °Chest wall pain is pain felt in or around the chest bones and muscles. It may take up to 6 weeks to get better. It may take longer if you are active. Chest wall pain can happen on its own. Other times, things like germs, injury, coughing, or exercise can cause the pain. °HOME CARE  °· Avoid activities that make you tired or cause pain. Try not to use your chest, belly (abdominal), or side muscles. Do not use heavy weights. °· Put ice on the sore area. °¨ Put ice in a plastic bag. °¨ Place a towel between your skin and the bag. °¨ Leave the ice on for 15-20 minutes for the first 2 days. °· Only take medicine as told by your doctor. °GET HELP RIGHT AWAY IF:  °· You have more pain or are very uncomfortable. °· You have a fever. °· Your chest pain gets worse. °· You have new problems. °· You feel sick to your stomach (nauseous) or throw up (vomit). °· You start to sweat or feel lightheaded. °· You have a cough with mucus (phlegm). °· You cough up blood. °MAKE SURE YOU:  °· Understand these instructions. °· Will watch your condition. °· Will get help right away if you are not doing well or get worse. °Document Released: 04/02/2008 Document Revised: 01/07/2012 Document Reviewed: 06/11/2011 °[employer]® Patient Information ©2015 [employer]. This information is not intended to replace advice given to you by your health care provider. Make sure you discuss any questions you have with your health care provider. ° °

## 2015-05-11 ENCOUNTER — Observation Stay (HOSPITAL_COMMUNITY)
Admission: EM | Admit: 2015-05-11 | Discharge: 2015-05-12 | Disposition: A | Discharge status: Home / Self Care | Payer: Self-pay | Attending: Internal Medicine | Admitting: Internal Medicine

## 2015-05-11 ENCOUNTER — Emergency Department (HOSPITAL_COMMUNITY)
Admission: EM | Admit: 2015-05-11 | Discharge: 2015-05-11 | Discharge status: Against Medical Advice | Payer: Self-pay | Attending: Emergency Medicine | Admitting: Emergency Medicine

## 2015-05-11 ENCOUNTER — Encounter (HOSPITAL_COMMUNITY): Payer: Self-pay | Admitting: Emergency Medicine

## 2015-05-11 ENCOUNTER — Encounter (HOSPITAL_COMMUNITY): Payer: Self-pay | Admitting: *Deleted

## 2015-05-11 DIAGNOSIS — M6282 Rhabdomyolysis: Principal | ICD-10-CM | POA: Insufficient documentation

## 2015-05-11 DIAGNOSIS — R252 Cramp and spasm: Secondary | ICD-10-CM | POA: Insufficient documentation

## 2015-05-11 DIAGNOSIS — Z720 Tobacco use: Secondary | ICD-10-CM | POA: Insufficient documentation

## 2015-05-11 DIAGNOSIS — Z872 Personal history of diseases of the skin and subcutaneous tissue: Secondary | ICD-10-CM | POA: Insufficient documentation

## 2015-05-11 DIAGNOSIS — Z79899 Other long term (current) drug therapy: Secondary | ICD-10-CM | POA: Insufficient documentation

## 2015-05-11 LAB — BASIC METABOLIC PANEL
ANION GAP: 10 (ref 5–15)
BUN: 9 mg/dL (ref 6–20)
CALCIUM: 9.7 mg/dL (ref 8.9–10.3)
CO2: 23 mmol/L (ref 22–32)
Chloride: 105 mmol/L (ref 101–111)
Creatinine, Ser: 1 mg/dL (ref 0.61–1.24)
Glucose, Bld: 94 mg/dL (ref 65–99)
Potassium: 3.8 mmol/L (ref 3.5–5.1)
Sodium: 138 mmol/L (ref 135–145)

## 2015-05-11 LAB — CBC WITH DIFFERENTIAL/PLATELET
BASOS PCT: 0 % (ref 0–1)
Basophils Absolute: 0 10*3/uL (ref 0.0–0.1)
EOS PCT: 4 % (ref 0–5)
Eosinophils Absolute: 0.4 10*3/uL (ref 0.0–0.7)
HCT: 43.2 % (ref 39.0–52.0)
Hemoglobin: 14.6 g/dL (ref 13.0–17.0)
LYMPHS PCT: 36 % (ref 12–46)
Lymphs Abs: 3.9 10*3/uL (ref 0.7–4.0)
MCH: 28.7 pg (ref 26.0–34.0)
MCHC: 33.8 g/dL (ref 30.0–36.0)
MCV: 85 fL (ref 78.0–100.0)
Monocytes Absolute: 0.6 10*3/uL (ref 0.1–1.0)
Monocytes Relative: 6 % (ref 3–12)
NEUTROS ABS: 5.8 10*3/uL (ref 1.7–7.7)
Neutrophils Relative %: 54 % (ref 43–77)
PLATELETS: 290 10*3/uL (ref 150–400)
RBC: 5.08 MIL/uL (ref 4.22–5.81)
RDW: 14.4 % (ref 11.5–15.5)
WBC: 10.7 10*3/uL — AB (ref 4.0–10.5)

## 2015-05-11 LAB — CK: Total CK: 1587 U/L — ABNORMAL HIGH (ref 49–397)

## 2015-05-11 MED ORDER — SODIUM CHLORIDE 0.9 % IV BOLUS (SEPSIS)
1000.0000 mL | Freq: Once | INTRAVENOUS | Status: AC
  Administered 2015-05-12: 1000 mL via INTRAVENOUS

## 2015-05-11 MED ORDER — MORPHINE SULFATE 4 MG/ML IJ SOLN
4.0000 mg | Freq: Once | INTRAMUSCULAR | Status: AC
  Administered 2015-05-12: 4 mg via INTRAVENOUS
  Filled 2015-05-11: qty 1

## 2015-05-11 MED ORDER — SODIUM CHLORIDE 0.9 % IV BOLUS (SEPSIS)
1000.0000 mL | Freq: Once | INTRAVENOUS | Status: AC
  Administered 2015-05-11: 1000 mL via INTRAVENOUS

## 2015-05-11 NOTE — ED Notes (Signed)
Pt reports taking pain meds at home with no relief, states that he doesn't want them at triage since they havent helped.

## 2015-05-11 NOTE — ED Notes (Signed)
Pt states he gets cramps throughout in entire body, states yesterday in shower his entire body locked up. Denies SOB, n/v. States girlfriend states he may have a blood clot.

## 2015-05-11 NOTE — H&P (Signed)
[facility] History and Physical  Devin Marsh ERD:[id number] DOB: 27-Feb-1986 DOA: 05/11/2015  Referring physician: ED physician PCP: No PCP Per Patient  Specialists:   Chief Complaint: Muscle cramps and pain  HPI: Devin Marsh is a 29 y.o. male with PMH of abscess, tobacco abuse, who presents with muscle cramps and pain.  Patient reports that easily he has been doing exercise in gym (weight lifting), taking niacin and muscle supplements from [facility] ([employer]), and also does repetitive work in a Restaurant manager, fast food. He states boxes are not generally heavy. He does sweat a great deal but is in an air-conditioned facility. Today he started having muscle cramps and pain all over his body. He states that he has completely cut protein out of his diet (has mostly been eating fruit).    Currently patient denies fever, chills, running nose, ear pain, headaches, cough, chest pain, SOB, abdominal pain, diarrhea, constipation, dysuria, urgency, frequency, hematuria, skin rashes or leg swelling. No unilateral weakness, numbness or tingling sensations. No vision change or hearing loss.  In ED, patient was found to have elevated CK 1587, WBC 10.7, temperature normal, tachycardia, renal function normal. Patient is admitted to inpatient for observation.  Where does patient live?   At home   Can patient participate in ADLs?  Yes      Review of Systems:   General: no fevers, chills, no changes in body weight, has fatigue HEENT: no blurry vision, hearing changes or sore throat Pulm: no dyspnea, coughing, wheezing CV: no chest pain, palpitations Abd: no nausea, vomiting, abdominal pain, diarrhea, constipation GU: no dysuria, burning on urination, increased urinary frequency, hematuria  Ext: no leg edema Neuro: no unilateral weakness, numbness, or tingling, no vision change or hearing loss Skin: no rash MSK: has muscle pain. no deformity, no limitation of range of movement in  spin Heme: No easy bruising.  Travel history: No recent long distant travel.  Allergy: No Known Allergies  Past Medical History  Diagnosis Date  . Abscess 04/13/2013    Past Surgical History  Procedure Laterality Date  . Appendectomy    . Incision and drainage perirectal abscess N/A 04/13/2013    Procedure: IRRIGATION AND DEBRIDEMENT PERIRECTAL ABSCESS;  Surgeon: [name], MD;  Location: [place];  Service: General;  Laterality: N/A;    Social History:  reports that he has been smoking.  He has never used smokeless tobacco. He reports that he does not drink alcohol or use illicit drugs.  Family History:  Family History  Problem Relation Age of Onset  . Diabetes Mother      Prior to Admission medications   Medication Sig Start Date End Date Taking? Authorizing Provider  acetaminophen (TYLENOL) 500 MG tablet Take 500 mg by mouth every 6 (six) hours as needed.    Historical Provider, MD  ibuprofen (ADVIL,MOTRIN) 200 MG tablet Take 200 mg by mouth every 6 (six) hours as needed.    Historical Provider, MD  naproxen sodium (ALEVE) 220 MG tablet Take 220 mg by mouth 2 (two) times daily as needed (for pain).    Historical Provider, MD  niacin 500 MG tablet Take 500 mg by mouth every morning.    Historical Provider, MD  oxyCODONE-acetaminophen (PERCOCET/ROXICET) 5-325 MG per tablet Take 1-2 tablets by mouth every 4 (four) hours as needed for severe pain. 02/28/15   Pattricia Boss, MD    Physical Exam: Filed Vitals:   05/11/15 2117 05/11/15 2338 05/12/15 0000 05/12/15 0037  BP: 125/64 121/75 121/70  133/69  Pulse: 119 103 95 80  Temp: 98.2 F (36.8 C)   98.2 F (36.8 C)  TempSrc: Oral   Oral  Resp: 18  14 16   Weight:    126.463 kg (278 lb 12.8 oz)  SpO2: 98% 99% 100% 100%   General: Not in acute distress HEENT:       Eyes: PERRL, EOMI, no scleral icterus.       ENT: No discharge from the ears and nose, no pharynx injection, no tonsillar enlargement.        Neck: No JVD, no bruit,  no mass felt. Heme: No neck lymph node enlargement. Cardiac: S1/S2, RRR, No murmurs, No gallops or rubs. Pulm: No rales, wheezing, rhonchi or rubs. Abd: Soft, nondistended, nontender, no rebound pain, no organomegaly, BS present. Ext: No pitting leg edema bilaterally. 2+DP/PT pulse bilaterally. Musculoskeletal: No joint deformities, No joint redness or warmth, no limitation of ROM in spin. Has generalized muscle cramps of arms and legs; no acute deformities; compartments soft. Skin: No rashes.  Neuro: Alert, oriented X3, cranial nerves II-XII grossly intact, muscle strength 5/5 in all extremities, sensation to light touch intact. Brachial reflex 2+ bilaterally. Knee reflex 1+ bilaterally. Negative Babinski's sign. Normal finger to nose test. Psych: Patient is not psychotic, no suicidal or hemocidal ideation.  Labs on Admission:  Basic Metabolic Panel:  Recent Labs Lab 05/11/15 1515  NA 138  K 3.8  CL 105  CO2 23  GLUCOSE 94  BUN 9  CREATININE 1.00  CALCIUM 9.7   Liver Function Tests: No results for input(s): AST, ALT, ALKPHOS, BILITOT, PROT, ALBUMIN in the last 168 hours. No results for input(s): LIPASE, AMYLASE in the last 168 hours. No results for input(s): AMMONIA in the last 168 hours. CBC:  Recent Labs Lab 05/11/15 1515  WBC 10.7*  NEUTROABS 5.8  HGB 14.6  HCT 43.2  MCV 85.0  PLT 290   Cardiac Enzymes:  Recent Labs Lab 05/11/15 1515  CKTOTAL 1587*    BNP (last 3 results) No results for input(s): BNP in the last 8760 hours.  ProBNP (last 3 results) No results for input(s): PROBNP in the last 8760 hours.  CBG: No results for input(s): GLUCAP in the last 168 hours.  Radiological Exams on Admission: No results found.  EKG:  Not done in ED, will get one.   Assessment/Plan Principal Problem:   Rhabdomyolysis Active Problems:   Tobacco abuse  Rhabdomyolysis: Patient's presentation is most likely caused by rhabdomyolysis given his strenuous exercise  history. His CK>1587. Cre is OK at 1.2.  - will admit to med-Surg bed  - IVF: received 2 L of NS in ED, will continue at 125 cc/h.  - will CK in AN - check phosphrus level and Mag level  - CMP for monitoring renal Fx and LF  - strict I/o for monitoring urine output  -check UDS and HIV ab  Tobacco abuse and Alcohol abuse: -Did counseling about importance of quitting smoking -Nicotine patch  DVT ppx: SQ Heparin    Code Status: Full code Family Communication:  Yes, patient's girlfriend at bed side Disposition Plan: Admit to inpatient   Date of Service 05/12/2015    Ivor Costa [facility] Pager [phone]  If 7PM-7AM, please contact night-coverage [email or web address] Password [email or web address] 05/12/2015, 1:26 AM

## 2015-05-11 NOTE — ED Provider Notes (Signed)
CSN: [id number]     Arrival date & time 05/11/15  1434 History   First MD Initiated Contact with Patient 05/11/15 1726     Chief Complaint  Patient presents with  . Pain     (Consider location/radiation/quality/duration/timing/severity/associated sxs/prior Treatment) The history is provided by the patient and medical records.    This is a 29 year old male with no significant past medical history, presenting to the ED for generalized bodily cramping. He states this has been progressively worsening over the past several days. States mainly localized to his arms, legs, and occasionally his abdomen. He states he has been taking over-the-counter Motrin and muscle relaxers without relief. He denies any fever or chills. Patient does repetitive work in a Restaurant manager, fast food. He states boxes are not generally heavy. He does sweat a great deal but is in an air-conditioned facility.  States he has been urinating less than normal, urine remains normal color (yellow/clear).  Patient also reports he has recently starting exercising at the gym (weight lifting), taking muscle supplements from [facility] ([employer]), and has completely cut protein out of his diet (has mostly been eating fruit).  Past Medical History  Diagnosis Date  . Abscess 04/13/2013   Past Surgical History  Procedure Laterality Date  . Appendectomy    . Incision and drainage perirectal abscess N/A 04/13/2013    Procedure: IRRIGATION AND DEBRIDEMENT PERIRECTAL ABSCESS;  Surgeon: [name], MD;  Location: [place];  Service: General;  Laterality: N/A;   History reviewed. No pertinent family history. History  Substance Use Topics  . Smoking status: Current Every Day Smoker    Last Attempt to Quit: 10/29/2008  . Smokeless tobacco: Never Used  . Alcohol Use: No    Review of Systems  Musculoskeletal: Positive for myalgias (cramping).  All other systems reviewed and are negative.     Allergies  Review of patient's allergies  indicates no known allergies.  Home Medications   Prior to Admission medications   Medication Sig Start Date End Date Taking? Authorizing Provider  cetirizine (ZYRTEC) 10 MG tablet Take 10 mg by mouth daily as needed for allergies.    Historical Provider, MD  oxyCODONE-acetaminophen (PERCOCET/ROXICET) 5-325 MG per tablet Take 1-2 tablets by mouth every 4 (four) hours as needed for severe pain. 02/28/15   Pattricia Boss, MD   BP 136/87 mmHg  Pulse 110  Temp(Src) 98.3 F (36.8 C) (Oral)  Resp 18  Ht 5\' 10"  (1.778 m)  Wt 279 lb 11.2 oz (126.871 kg)  BMI 40.13 kg/m2  SpO2 97%   Physical Exam  Constitutional: He is oriented to person, place, and time. He appears well-developed and well-nourished.  HENT:  Head: Normocephalic and atraumatic.  Mouth/Throat: Oropharynx is clear and moist.  Eyes: Conjunctivae and EOM are normal. Pupils are equal, round, and reactive to light.  Neck: Normal range of motion.  Cardiovascular: Normal rate, regular rhythm and normal heart sounds.   Pulmonary/Chest: Effort normal and breath sounds normal.  Abdominal: Soft. Bowel sounds are normal.  Musculoskeletal: Normal range of motion.  Endorses generalized cramping of arms and legs, no acute deformities; compartments soft; extremities NVI x4  Neurological: He is alert and oriented to person, place, and time.  Skin: Skin is warm and dry.  Psychiatric: He has a normal mood and affect.  Nursing note and vitals reviewed.   ED Course  Procedures (including critical care time) Labs Review Labs Reviewed  CBC WITH DIFFERENTIAL/PLATELET - Abnormal; Notable for the following:  WBC 10.7 (*)    All other components within normal limits  CK - Abnormal; Notable for the following:    Total CK 1587 (*)    All other components within normal limits  BASIC METABOLIC PANEL    Imaging Review No results found.   EKG Interpretation None      MDM   Final diagnoses:  Non-traumatic rhabdomyolysis   29 y.o. M  here with generalized bodily cramping.  Labwork notable for elevated CK 1587 consistent with rhabdomyolysis. Potassium is within normal limits. Renal function is also preserved.  Rhabdo likely from increased exercise +/- supplements and dietary changes. I discussed with patient meeting of his diagnosis and need for vigorous IV fluids, however he states he waited too long in the waiting room and now has to go pick up his girlfriend from work. He states he absolutely cannot stay at this time for IV fluids, however will return here once he has picked her up.  He was advised against the risks of going without treatment including worsening condition, possible renal impairment/failure, muscle death, compartment syndrome, etc-- he acknowledged these risks and is adamant that he will return here later today.  He will sign out AMA at this time.  Larene Pickett, PA-C 05/11/15 1818  Milton Ferguson, MD 05/11/15 2219

## 2015-05-11 NOTE — ED Provider Notes (Signed)
CSN: [id number]     Arrival date & time 05/11/15  2105 History   First MD Initiated Contact with Patient 05/11/15 2308     Chief Complaint  Patient presents with  . rhabdomyolysis      (Consider location/radiation/quality/duration/timing/severity/associated sxs/prior Treatment) The history is provided by the patient and medical records.    29 year old male with no significant past medical history here for rhabdomyolysis. I saw and evaluated patient earlier this evening for generalized muscle aches/cramping-- see prior note as below.  States mainly localized to his arms, legs, and occasionally his abdomen. He states he has been taking over-the-counter Motrin and muscle relaxers without relief. He denies any fever or chills. Patient does repetitive work in a Restaurant manager, fast food. He states boxes are not generally heavy. He does sweat a great deal but is in an air-conditioned facility. States he has been urinating less than normal, urine remains normal color (yellow/clear). Patient also reports he has recently starting exercising at the gym (weight lifting), taking muscle supplements from [facility] ([employer]), and has completely cut protein out of his diet (has mostly been eating fruit).  No statin therapy.  Past Medical History  Diagnosis Date  . Abscess 04/13/2013   Past Surgical History  Procedure Laterality Date  . Appendectomy    . Incision and drainage perirectal abscess N/A 04/13/2013    Procedure: IRRIGATION AND DEBRIDEMENT PERIRECTAL ABSCESS;  Surgeon: [name], MD;  Location: [place];  Service: General;  Laterality: N/A;   No family history on file. History  Substance Use Topics  . Smoking status: Current Every Day Smoker    Last Attempt to Quit: 10/29/2008  . Smokeless tobacco: Never Used  . Alcohol Use: No    Review of Systems  Musculoskeletal: Positive for myalgias.  All other systems reviewed and are negative.     Allergies  Review of patient's allergies  indicates no known allergies.  Home Medications   Prior to Admission medications   Medication Sig Start Date End Date Taking? Authorizing Provider  acetaminophen (TYLENOL) 500 MG tablet Take 500 mg by mouth every 6 (six) hours as needed.    Historical Provider, MD  ibuprofen (ADVIL,MOTRIN) 200 MG tablet Take 200 mg by mouth every 6 (six) hours as needed.    Historical Provider, MD  naproxen sodium (ALEVE) 220 MG tablet Take 220 mg by mouth 2 (two) times daily as needed (for pain).    Historical Provider, MD  niacin 500 MG tablet Take 500 mg by mouth every morning.    Historical Provider, MD  oxyCODONE-acetaminophen (PERCOCET/ROXICET) 5-325 MG per tablet Take 1-2 tablets by mouth every 4 (four) hours as needed for severe pain. 02/28/15   Pattricia Boss, MD   BP 125/64 mmHg  Pulse 119  Temp(Src) 98.2 F (36.8 C) (Oral)  Resp 18  SpO2 98%   Physical Exam  Constitutional: He is oriented to person, place, and time. He appears well-developed and well-nourished. No distress.  HENT:  Head: Normocephalic and atraumatic.  Mouth/Throat: Oropharynx is clear and moist.  Eyes: Conjunctivae and EOM are normal. Pupils are equal, round, and reactive to light.  Neck: Normal range of motion. Neck supple.  Cardiovascular: Normal rate, regular rhythm and normal heart sounds.   Pulmonary/Chest: Effort normal and breath sounds normal. No respiratory distress. He has no wheezes.  Abdominal: Soft. Bowel sounds are normal. There is no tenderness. There is no guarding.  Musculoskeletal: Normal range of motion.  Endorses generalized muscle cramps of arms and  legs; no acute deformities; compartments soft; extremities NVI x4  Neurological: He is alert and oriented to person, place, and time.  Skin: Skin is warm and dry. He is not diaphoretic.  Psychiatric: He has a normal mood and affect.  Nursing note and vitals reviewed.   ED Course  Procedures (including critical care time) Labs Review Labs Reviewed - No  data to display   CBC WITH DIFFERENTIAL/PLATELET - Abnormal; Notable for the following:    WBC 10.7 (*)    All other components within normal limits  CK - Abnormal; Notable for the following:    Total CK 1587 (*)    All other components within normal limits  BASIC METABOLIC PANEL         Imaging Review No results found.   EKG Interpretation None      MDM   Final diagnoses:  Non-traumatic rhabdomyolysis   29 year old male here with rhabdomyolysis, likely secondary to increased exercise +/- nutrition supplementation and dietary changes.  Renal function preserved, normal electrolytes.  No signs of compartment syndrome or DVT on exam.  Mild tachycardia, VS otherwise stable.  IVF started.  Will admit to medicine service for continued IV hydration.  Larene Pickett, PA-C 05/12/15 0036  Milton Ferguson, MD 05/12/15 [phone]

## 2015-05-11 NOTE — ED Notes (Signed)
Pt states he was seen here earlier today for rhabdomyolysis and left to go pick up visitor.  States he told staff he would return.  C/o pain all over at this time.

## 2015-05-11 NOTE — ED Notes (Signed)
Pt reports having cramping pain to entire body, including arms, legs, and back. Was seen at [facility] today but left prior to being seen. No relief with pain meds and muscle relaxers.

## 2015-05-11 NOTE — Discharge Instructions (Signed)
I recommend that you return here as soon as possible-- you need IV fluids to correct muscle injury.   Rhabdomyolysis Rhabdomyolysis is the breakdown of muscle fibers due to injury. The injury may come from physical damage to the muscle like an injury but other causes are:  High fever (hyperthermia).  Seizures (convulsions).  Low phosphate levels.  Diseases of metabolism.  Heatstroke.  Drug toxicity.  Over exertion.  Alcoholism.  Muscle is cut off from oxygen (anoxia).  The squeezing of nerves and blood vessels (compartment syndrome). Some drugs which may cause the breakdown of muscle are:  Antibiotics.  Statins.  Alcohol.  Animal toxins. Myoglobin is a substance which helps muscle use oxygen. When the muscle is damaged, the myoglobin is released into the bloodstream. It is filtered out of the bloodstream by the kidneys. Myoglobin may block up the kidneys. This may cause damage, such as kidney failure. It also breaks down into other damaging toxic parts, which also cause kidney failure.  SYMPTOMS   Dark, red, or tea colored urine.  Weakness of affected muscles.  Weight gain from water retention.  Joint aches and pains.  Irregular heart from high potassium in the blood.  Muscle tenderness or aching.  Generalized weakness.  Seizures.  Feeling tired (fatigue). DIAGNOSIS  Your caregiver may find muscle tenderness on exam and suspect the problem. Urine tests and blood work can confirm the problem. TREATMENT   Early and aggressive treatment with large amounts of fluids may help prevent kidney failure.  Water producing medicine (diuretic) may be used to help flush the kidneys.  High potassium and calcium problems (electrolyte) in your blood may need treatment. HOME CARE INSTRUCTIONS  This problem is usually cared for in a hospital. If you are allowed to go home and require dialysis, make sure you keep all appointments for lab work and dialysis. Not doing so  could result in death. Document Released: 10-20-04 Document Revised: 01/07/2012 Document Reviewed: 04/11/2009 [employer] Patient Information 2015 [place], [place]. This information is not intended to replace advice given to you by your health care provider. Make sure you discuss any questions you have with your health care provider.

## 2015-05-11 NOTE — ED Notes (Signed)
Pt states that he was tired of waiting, explained to pt how ED processed work and pt stated that he was going to another hospital.

## 2015-05-12 ENCOUNTER — Encounter (HOSPITAL_COMMUNITY): Payer: Self-pay | Admitting: Internal Medicine

## 2015-05-12 DIAGNOSIS — Z720 Tobacco use: Secondary | ICD-10-CM

## 2015-05-12 DIAGNOSIS — M6282 Rhabdomyolysis: Secondary | ICD-10-CM

## 2015-05-12 LAB — COMPREHENSIVE METABOLIC PANEL
ALK PHOS: 79 U/L (ref 38–126)
ALT: 55 U/L (ref 17–63)
AST: 39 U/L (ref 15–41)
Albumin: 3.6 g/dL (ref 3.5–5.0)
Anion gap: 8 (ref 5–15)
BUN: 9 mg/dL (ref 6–20)
CALCIUM: 8.5 mg/dL — AB (ref 8.9–10.3)
CHLORIDE: 103 mmol/L (ref 101–111)
CO2: 25 mmol/L (ref 22–32)
Creatinine, Ser: 1.12 mg/dL (ref 0.61–1.24)
GFR calc Af Amer: >60 mL/min (ref >60)
Glucose, Bld: 112 mg/dL — ABNORMAL HIGH (ref 65–99)
Potassium: 3.7 mmol/L (ref 3.5–5.1)
Sodium: 136 mmol/L (ref 135–145)
Total Bilirubin: 0.4 mg/dL (ref 0.3–1.2)
Total Protein: 6.2 g/dL — ABNORMAL LOW (ref 6.5–8.1)

## 2015-05-12 LAB — RAPID URINE DRUG SCREEN, HOSP PERFORMED
Amphetamines: NOT DETECTED
BARBITURATES: NOT DETECTED
BENZODIAZEPINES: NOT DETECTED
Cocaine: NOT DETECTED
Opiates: POSITIVE — AB
Tetrahydrocannabinol: POSITIVE — AB

## 2015-05-12 LAB — CBC
HCT: 40 % (ref 39.0–52.0)
Hemoglobin: 13 g/dL (ref 13.0–17.0)
MCH: 28 pg (ref 26.0–34.0)
MCHC: 32.5 g/dL (ref 30.0–36.0)
MCV: 86 fL (ref 78.0–100.0)
PLATELETS: 253 10*3/uL (ref 150–400)
RBC: 4.65 MIL/uL (ref 4.22–5.81)
RDW: 14.6 % (ref 11.5–15.5)
WBC: 10.1 10*3/uL (ref 4.0–10.5)

## 2015-05-12 LAB — PHOSPHORUS: Phosphorus: 4.1 mg/dL (ref 2.5–4.6)

## 2015-05-12 LAB — CK: Total CK: 1176 U/L — ABNORMAL HIGH (ref 49–397)

## 2015-05-12 LAB — HIV ANTIBODY (ROUTINE TESTING W REFLEX): HIV Screen 4th Generation wRfx: NONREACTIVE

## 2015-05-12 LAB — MAGNESIUM: MAGNESIUM: 2.2 mg/dL (ref 1.7–2.4)

## 2015-05-12 MED ORDER — OXYCODONE-ACETAMINOPHEN 5-325 MG PO TABS: 1.0000 | ORAL_TABLET | ORAL | Status: DC | PRN

## 2015-05-12 MED ORDER — HEPARIN SODIUM (PORCINE) 5000 UNIT/ML IJ SOLN
5000.0000 [IU] | Freq: Three times a day (TID) | INTRAMUSCULAR | Status: DC
  Filled 2015-05-12 (×3): qty 1

## 2015-05-12 MED ORDER — ENSURE ENLIVE PO LIQD: 237.0000 mL | Freq: Two times a day (BID) | ORAL | Status: DC

## 2015-05-12 MED ORDER — SODIUM CHLORIDE 0.9 % IV SOLN
INTRAVENOUS | Status: DC
  Administered 2015-05-12 (×2): via INTRAVENOUS

## 2015-05-12 MED ORDER — NICOTINE 21 MG/24HR TD PT24
21.0000 mg | MEDICATED_PATCH | Freq: Every day | TRANSDERMAL | Status: DC
  Filled 2015-05-12: qty 1

## 2015-05-12 MED ORDER — MORPHINE SULFATE 4 MG/ML IJ SOLN
2.0000 mg | INTRAMUSCULAR | Status: DC | PRN
Start: 2015-05-12 — End: 2015-05-12
  Administered 2015-05-12 (×2): 2 mg via INTRAVENOUS
  Filled 2015-05-12 (×2): qty 1

## 2015-05-12 MED ORDER — NIACIN 500 MG PO TABS
500.0000 mg | ORAL_TABLET | Freq: Every morning | ORAL | Status: DC
  Filled 2015-05-12: qty 1

## 2015-05-12 MED ORDER — CYCLOBENZAPRINE HCL 5 MG PO TABS: 5.0000 mg | ORAL_TABLET | Freq: Every evening | ORAL | Status: DC | PRN

## 2015-05-12 MED ORDER — ALUM & MAG HYDROXIDE-SIMETH 200-200-20 MG/5ML PO SUSP: 30.0000 mL | Freq: Four times a day (QID) | ORAL | Status: DC | PRN

## 2015-05-12 MED ORDER — ACETAMINOPHEN 500 MG PO TABS: 500.0000 mg | ORAL_TABLET | Freq: Four times a day (QID) | ORAL | Status: DC | PRN

## 2015-05-12 NOTE — Discharge Summary (Signed)
Physician Discharge Summary  [name] IOE:[id number] DOB: 08/31/1986 DOA: 05/11/2015  PCP: No PCP Per Patient  Admit date: 05/11/2015 Discharge date: 05/12/2015  Time spent: 45 minutes  Recommendations for Outpatient Follow-up:  -Will be discharged home today. -Advised to follow-up with primary care provider in 2 weeks.   Discharge Diagnoses:  Principal Problem:   Rhabdomyolysis Active Problems:   Tobacco abuse   Discharge Condition: Stable and improved  Filed Weights   05/12/15 0037  Weight: 126.463 kg (278 lb 12.8 oz)    History of present illness:  Devin Marsh is a 29 y.o. male with PMH of abscess, tobacco abuse, who presents with muscle cramps and pain.  Patient reports that easily he has been doing exercise in gym (weight lifting), taking niacin and muscle supplements from [facility] ([employer]), and also does repetitive work in a Restaurant manager, fast food. He states boxes are not generally heavy. He does sweat a great deal but is in an air-conditioned facility. Today he started having muscle cramps and pain all over his body. He states that he has completely cut protein out of his diet (has mostly been eating fruit).   Currently patient denies fever, chills, running nose, ear pain, headaches, cough, chest pain, SOB, abdominal pain, diarrhea, constipation, dysuria, urgency, frequency, hematuria, skin rashes or leg swelling. No unilateral weakness, numbness or tingling sensations. No vision change or hearing loss.  In ED, patient was found to have elevated CK 1587, WBC 10.7, temperature normal, tachycardia, renal function normal. Patient is admitted to inpatient for observation.  Hospital Course:   Rhabdomyolysis -This diagnosis seems to encompass his clinical symptoms plus a CK level of 1500 on admission. -He has not had any renal insufficiency. -He has received approximately 5 L of fluid since admission. -Muscle cramps have improved. -CK level is down to  1000 today. -Patient is not feeling nauseated or having vomiting, hence I believe he will be able to maintain hydration via oral route and as such will discharge home today.   Procedures:  None   Consultations:  None  Discharge Instructions  Discharge Instructions    Increase activity slowly    Complete by:  As directed             Medication List    STOP taking these medications        ALEVE 220 MG tablet  Generic drug:  naproxen sodium      TAKE these medications        acetaminophen 500 MG tablet  Commonly known as:  TYLENOL  Take 500 mg by mouth every 6 (six) hours as needed.     cyclobenzaprine 5 MG tablet  Commonly known as:  FLEXERIL  Take 1 tablet (5 mg total) by mouth at bedtime as needed for muscle spasms.     ibuprofen 200 MG tablet  Commonly known as:  ADVIL,MOTRIN  Take 200 mg by mouth every 6 (six) hours as needed.       No Known Allergies     Follow-up Information    Follow up In 2 weeks.   Why:  With your regular provider       The results of significant diagnostics from this hospitalization (including imaging, microbiology, ancillary and laboratory) are listed below for reference.    Significant Diagnostic Studies: No results found.  Microbiology: No results found for this or any previous visit (from the past 240 hour(s)).   Labs: Basic Metabolic Panel:  Recent Labs Lab 05/11/15 1515  05/12/15 0536  NA 138 136  K 3.8 3.7  CL 105 103  CO2 23 25  GLUCOSE 94 112*  BUN 9 9  CREATININE 1.00 1.12  CALCIUM 9.7 8.5*  MG  --  2.2  PHOS  --  4.1   Liver Function Tests:  Recent Labs Lab 05/12/15 0536  AST 39  ALT 55  ALKPHOS 79  BILITOT 0.4  PROT 6.2*  ALBUMIN 3.6   No results for input(s): LIPASE, AMYLASE in the last 168 hours. No results for input(s): AMMONIA in the last 168 hours. CBC:  Recent Labs Lab 05/11/15 1515 05/12/15 0536  WBC 10.7* 10.1  NEUTROABS 5.8  --   HGB 14.6 13.0  HCT 43.2 40.0  MCV 85.0  86.0  PLT 290 253   Cardiac Enzymes:  Recent Labs Lab 05/11/15 1515 05/12/15 0536  CKTOTAL 1587* 1176*   BNP: BNP (last 3 results) No results for input(s): BNP in the last 8760 hours.  ProBNP (last 3 results) No results for input(s): PROBNP in the last 8760 hours.  CBG: No results for input(s): GLUCAP in the last 168 hours.     SignedLelon Frohlich  [facility] Pager: [phone] 05/12/2015, 1:07 PM

## 2015-05-12 NOTE — Progress Notes (Signed)
05/12/2015 1:19 PM  Discharge instructions and AVS reviewed with patient.  Pt asked questions appropriately and verbalized understanding of discharge education.  Prescription given and a copy of the AVS sent home with patient.  IV removed.  Pt escorted downstairs via wheelchair by volunteers.   Princella Pellegrini

## 2016-10-15 ENCOUNTER — Ambulatory Visit
Admission: RE | Admit: 2016-10-15 | Discharge: 2016-10-15 | Disposition: A | Discharge status: Home / Self Care | Payer: 59 | Source: Ambulatory Visit | Attending: Family Medicine | Admitting: Family Medicine

## 2016-10-15 ENCOUNTER — Other Ambulatory Visit: Payer: Self-pay | Admitting: Family Medicine

## 2016-10-15 DIAGNOSIS — R52 Pain, unspecified: Secondary | ICD-10-CM

## 2016-10-26 ENCOUNTER — Other Ambulatory Visit: Payer: Self-pay | Admitting: *Deleted

## 2016-10-26 DIAGNOSIS — R200 Anesthesia of skin: Secondary | ICD-10-CM

## 2016-11-17 ENCOUNTER — Emergency Department (HOSPITAL_BASED_OUTPATIENT_CLINIC_OR_DEPARTMENT_OTHER)
Admission: EM | Admit: 2016-11-17 | Discharge: 2016-11-17 | Disposition: A | Discharge status: Home / Self Care | Payer: 59 | Attending: Emergency Medicine | Admitting: Emergency Medicine

## 2016-11-17 ENCOUNTER — Encounter (HOSPITAL_BASED_OUTPATIENT_CLINIC_OR_DEPARTMENT_OTHER): Payer: Self-pay | Admitting: Emergency Medicine

## 2016-11-17 DIAGNOSIS — H6502 Acute serous otitis media, left ear: Secondary | ICD-10-CM | POA: Insufficient documentation

## 2016-11-17 DIAGNOSIS — J0100 Acute maxillary sinusitis, unspecified: Secondary | ICD-10-CM

## 2016-11-17 DIAGNOSIS — F17200 Nicotine dependence, unspecified, uncomplicated: Secondary | ICD-10-CM | POA: Insufficient documentation

## 2016-11-17 MED ORDER — AMOXICILLIN 500 MG PO CAPS: 500.0000 mg | ORAL_CAPSULE | Freq: Three times a day (TID) | ORAL | 0 refills | Status: DC

## 2016-11-17 NOTE — ED Provider Notes (Signed)
[facility] DEPT [facility] Provider Note   CSN: [id number] Arrival date & time: 11/17/16  1518  By signing my name below, I, Hansel Feinstein, attest that this documentation has been prepared under the direction and in the presence of  Heath. Janit Bern, NP. Electronically Signed: Hansel Feinstein, ED Scribe. 11/17/16. 5:15 PM.    History   Chief Complaint Chief Complaint  Patient presents with  . Ear Drainage    HPI Devin Marsh is a 31 y.o. male who presents to the Emergency Department complaining of intermittent, gradually worsening left ear light brown drainage that began 2-3 weeks ago with associated pain, itching, muffled hearing. He states his symptoms are improved temporarily with swallowing and in the shower with the steam. He also states he has tried pseudoephedrine and Zyrtec for the last 3 weeks with no relief. He reports he has been taking Tylenol and ibuprofen with adequate relief of pain. Pt reports he had the flu the last week of December 2017, but states all his symptoms have improved aside from some residual nasal congestion. He denies fever, chills, abdominal pain, neck pain, back pain, vomiting, diarrhea.   The history is provided by the patient. No language interpreter was used.  Ear Drainage  This is a new problem. The current episode started more than 1 week ago. The problem occurs daily. The problem has been gradually worsening. Pertinent negatives include no abdominal pain. Nothing aggravates the symptoms. Relieved by: steam. Treatments tried: steam, Zyrtec, decongestants. The treatment provided mild relief.    Past Medical History:  Diagnosis Date  . Abscess 04/13/2013    Patient Active Problem List   Diagnosis Date Noted  . Non-traumatic rhabdomyolysis   . Rhabdomyolysis 05/11/2015  . Tobacco abuse 05/11/2015    Past Surgical History:  Procedure Laterality Date  . APPENDECTOMY    . INCISION AND DRAINAGE PERIRECTAL ABSCESS N/A 04/13/2013   Procedure: IRRIGATION AND  DEBRIDEMENT PERIRECTAL ABSCESS;  Surgeon: [name], MD;  Location: [facility];  Service: General;  Laterality: N/A;       Home Medications    Prior to Admission medications   Medication Sig Start Date End Date Taking? Authorizing Provider  acetaminophen (TYLENOL) 500 MG tablet Take 500 mg by mouth every 6 (six) hours as needed.    Historical Provider, MD  amoxicillin (AMOXIL) 500 MG capsule Take 1 capsule (500 mg total) by mouth 3 (three) times daily. 11/17/16   Jacayla Nordell Bunnie Pion, NP  cyclobenzaprine (FLEXERIL) 5 MG tablet Take 1 tablet (5 mg total) by mouth at bedtime as needed for muscle spasms. 05/12/15   Erline Hau, MD  ibuprofen (ADVIL,MOTRIN) 200 MG tablet Take 200 mg by mouth every 6 (six) hours as needed.    Historical Provider, MD    Family History Family History  Problem Relation Age of Onset  . Diabetes Mother     Social History Social History  Substance Use Topics  . Smoking status: Current Every Day Smoker    Last attempt to quit: 10/29/2008  . Smokeless tobacco: Never Used  . Alcohol use No     Allergies   Patient has no known allergies.   Review of Systems Review of Systems  Constitutional: Negative for chills and fever.  HENT: Positive for congestion, ear discharge, ear pain and hearing loss.   Gastrointestinal: Negative for abdominal pain, diarrhea and vomiting.  Musculoskeletal: Negative for back pain and neck pain.  All other systems reviewed and are negative.    Physical Exam  Updated Vital Signs BP 137/86 (BP Location: Right Arm)   Pulse 88   Temp 98.8 F (37.1 C) (Oral)   Resp 18   Ht 5\' 10"  (1.778 m)   Wt 97.1 kg   SpO2 100%   BMI 30.71 kg/m   Physical Exam  Constitutional: He appears well-developed and well-nourished.  HENT:  Head: Normocephalic.  Right Ear: Tympanic membrane normal.  Left Ear: Tympanic membrane is erythematous. Tympanic membrane is not perforated.  Nose: Right sinus exhibits maxillary sinus tenderness.  Left sinus exhibits maxillary sinus tenderness.  Mouth/Throat: Uvula is midline, oropharynx is clear and moist and mucous membranes are normal.  No mastoid tenderness. Fluid behind left TM. Tender over the maxillary sinuses.   Eyes: Conjunctivae and EOM are normal. Pupils are equal, round, and reactive to light. No scleral icterus.  Cardiovascular: Normal rate.   Pulmonary/Chest: Effort normal. No respiratory distress.  Abdominal: He exhibits no distension.  Musculoskeletal: Normal range of motion.  Neurological: He is alert.  Skin: Skin is warm and dry.  Psychiatric: He has a normal mood and affect. His behavior is normal.  Nursing note and vitals reviewed.    ED Treatments / Results   DIAGNOSTIC STUDIES: Oxygen Saturation is 100% on RA, normal by my interpretation.    COORDINATION OF CARE: 5:12 PM Discussed treatment plan with pt at bedside which includes symptomatic therapy and pt agreed to plan.    Labs (all labs ordered are listed, but only abnormal results are displayed) Labs Reviewed - No data to display   Radiology No results found.  Procedures Procedures (including critical care time)  Medications Ordered in ED Medications - No data to display   Initial Impression / Assessment and Plan / ED Course  I have reviewed the triage vital signs and the nursing notes.    Suspect ear and sinus infection. Pt provided with f/u for ENT. Will d/c pt with amoxicillin. Advised pt to continue taking antiinflammatories PRN, antihistamines and decongestants. Also advised pt to use cool air vaporizer at home. Conservative therapies discussed and recommended. Patient appears stable for discharge at this time. Return precautions discussed and outlined in discharge paperwork. Patient is agreeable to plan.     Final Clinical Impressions(s) / ED Diagnoses   Final diagnoses:  Acute maxillary sinusitis, recurrence not specified  Acute serous otitis media of left ear, recurrence not  specified    New Prescriptions Discharge Medication List as of 11/17/2016  5:16 PM    START taking these medications   Details  amoxicillin (AMOXIL) 500 MG capsule Take 1 capsule (500 mg total) by mouth 3 (three) times daily., Starting Sat 11/17/2016, Print        I personally performed the services described in this documentation, which was scribed in my presence. The recorded information has been reviewed and is accurate.     [address] [place], [place] 11/18/16 [place], MD 11/18/16 [id number]

## 2016-11-17 NOTE — ED Triage Notes (Signed)
Patient states that for that last week he has had pain to his left ear that has pain, now he is nausea and he can not tolerate the pain

## 2016-11-17 NOTE — Discharge Instructions (Signed)
Continue to take the Sudafed and use the cool mist vaporizer. Follow up with ENT. Return here as needed.

## 2016-11-22 ENCOUNTER — Encounter: Payer: 59 | Admitting: Neurology

## 2016-11-22 DIAGNOSIS — Z029 Encounter for administrative examinations, unspecified: Secondary | ICD-10-CM

## 2017-04-03 ENCOUNTER — Encounter (HOSPITAL_COMMUNITY): Payer: Self-pay | Admitting: Emergency Medicine

## 2017-04-03 ENCOUNTER — Emergency Department (HOSPITAL_COMMUNITY)
Admission: EM | Admit: 2017-04-03 | Discharge: 2017-04-03 | Disposition: A | Discharge status: Home / Self Care | Payer: 59 | Attending: Emergency Medicine | Admitting: Emergency Medicine

## 2017-04-03 DIAGNOSIS — F17210 Nicotine dependence, cigarettes, uncomplicated: Secondary | ICD-10-CM | POA: Insufficient documentation

## 2017-04-03 DIAGNOSIS — J0101 Acute recurrent maxillary sinusitis: Secondary | ICD-10-CM

## 2017-04-03 DIAGNOSIS — J0100 Acute maxillary sinusitis, unspecified: Secondary | ICD-10-CM | POA: Insufficient documentation

## 2017-04-03 MED ORDER — AZITHROMYCIN 250 MG PO TABS: 250.0000 mg | ORAL_TABLET | Freq: Every day | ORAL | 0 refills | Status: DC

## 2017-04-03 MED ORDER — DEXAMETHASONE 4 MG PO TABS
10.0000 mg | ORAL_TABLET | Freq: Once | ORAL | Status: AC
  Administered 2017-04-03: 10 mg via ORAL
  Filled 2017-04-03: qty 3

## 2017-04-03 NOTE — ED Triage Notes (Signed)
Pt reports left ear pain and cough x2 days, reports taking mucinex and sudafed with no relief. Pt reports he is supposed to f/u with ENT for constant ear problems. Pt a/ox4, resp e/u.

## 2017-04-03 NOTE — ED Notes (Signed)
ED Provider at bedside. 

## 2017-04-03 NOTE — ED Provider Notes (Signed)
[facility] DEPT Provider Note   CSN: [id number] Arrival date & time: 04/03/17  0530     History   Chief Complaint Chief Complaint  Patient presents with  . Otalgia    HPI Devin Marsh is a 31 y.o. male.  Patient presents with complaint of left ear pain for the past 2 days. No fever or chills. He denies nasal congestion but has a dry cough. No sneezing or eye symptoms. He has tried taking OTC medications (Mucinex, Sudafed, Tylenol) without relief. No nausea or vomiting.    The history is provided by the patient. No language interpreter was used.  Otalgia  Associated symptoms include cough. Pertinent negatives include no headaches and no vomiting.    Past Medical History:  Diagnosis Date  . Abscess 04/13/2013    Patient Active Problem List   Diagnosis Date Noted  . Non-traumatic rhabdomyolysis   . Rhabdomyolysis 05/11/2015  . Tobacco abuse 05/11/2015    Past Surgical History:  Procedure Laterality Date  . APPENDECTOMY    . INCISION AND DRAINAGE PERIRECTAL ABSCESS N/A 04/13/2013   Procedure: IRRIGATION AND DEBRIDEMENT PERIRECTAL ABSCESS;  Surgeon: [name], MD;  Location: [facility];  Service: General;  Laterality: N/A;       Home Medications    Prior to Admission medications   Medication Sig Start Date End Date Taking? Authorizing Provider  acetaminophen (TYLENOL) 500 MG tablet Take 500 mg by mouth every 6 (six) hours as needed.    [provider]  amoxicillin (AMOXIL) 500 MG capsule Take 1 capsule (500 mg total) by mouth 3 (three) times daily. 11/17/16   Ashley Murrain, NP  cyclobenzaprine (FLEXERIL) 5 MG tablet Take 1 tablet (5 mg total) by mouth at bedtime as needed for muscle spasms. 05/12/15   Isaac Bliss, Rayford Halsted, MD  ibuprofen (ADVIL,MOTRIN) 200 MG tablet Take 200 mg by mouth every 6 (six) hours as needed.    [provider]    Family History Family History  Problem Relation Age of Onset  . Diabetes Mother     Social  History Social History  Substance Use Topics  . Smoking status: Current Every Day Smoker    Last attempt to quit: 10/29/2008  . Smokeless tobacco: Never Used  . Alcohol use No     Allergies   Patient has no known allergies.   Review of Systems Review of Systems  Constitutional: Negative for chills and fever.  HENT: Positive for ear pain. Negative for congestion, sneezing and trouble swallowing.   Respiratory: Positive for cough. Negative for shortness of breath.   Gastrointestinal: Negative for nausea and vomiting.  Musculoskeletal: Negative for myalgias.  Neurological: Negative for headaches.     Physical Exam Updated Vital Signs BP (!) 142/96   Pulse 98   Temp 98 F (36.7 C)   Resp 14   SpO2 99%   Physical Exam  Constitutional: He is oriented to person, place, and time. He appears well-developed and well-nourished.  HENT:  Head: Normocephalic.  Right Ear: Tympanic membrane and external ear normal.  Left Ear: External ear normal. Tympanic membrane is injected.  Nose: Mucosal edema present. Right sinus exhibits maxillary sinus tenderness. Left sinus exhibits maxillary sinus tenderness.  Neck: Normal range of motion. Neck supple.  Cardiovascular: Normal rate and regular rhythm.   No murmur heard. Pulmonary/Chest: Effort normal and breath sounds normal. He has no wheezes. He has no rales.  Abdominal: Soft. Bowel sounds are normal. There is no tenderness. There is no  rebound and no guarding.  Musculoskeletal: Normal range of motion.  Neurological: He is alert and oriented to person, place, and time.  Skin: Skin is warm and dry.  Psychiatric: He has a normal mood and affect.     ED Treatments / Results  Labs (all labs ordered are listed, but only abnormal results are displayed) Labs Reviewed - No data to display  EKG  EKG Interpretation None       Radiology No results found.  Procedures Procedures (including critical care time)  Medications Ordered in  ED Medications - No data to display   Initial Impression / Assessment and Plan / ED Course  I have reviewed the triage vital signs and the nursing notes.  Pertinent labs & imaging results that were available during my care of the patient were reviewed by me and considered in my medical decision making (see chart for details).     Patient with complaint of Ear pain and cough. No ST, fever. OTC medication w/o relief. He has signs of sinus inflammation and pain. Will treat in consideration of sinus infection. Encourage primary care follow up.  Final Clinical Impressions(s) / ED Diagnoses   Final diagnoses:  None   1. Maxillary sinusitis  New Prescriptions New Prescriptions   No medications on file     Charlann Lange, Hershal Coria 04/17/17 [id number]    Merryl Hacker, MD 04/17/17 2340

## 2018-09-29 IMAGING — CR DG FINGER THUMB 2+V*R*
3 series · 3 of 3 positions shown · non-contrast
Comparison: None.

CLINICAL DATA: Right thumb pain without known injury.

EXAM:
RIGHT THUMB 2+V

[x finger pa right]
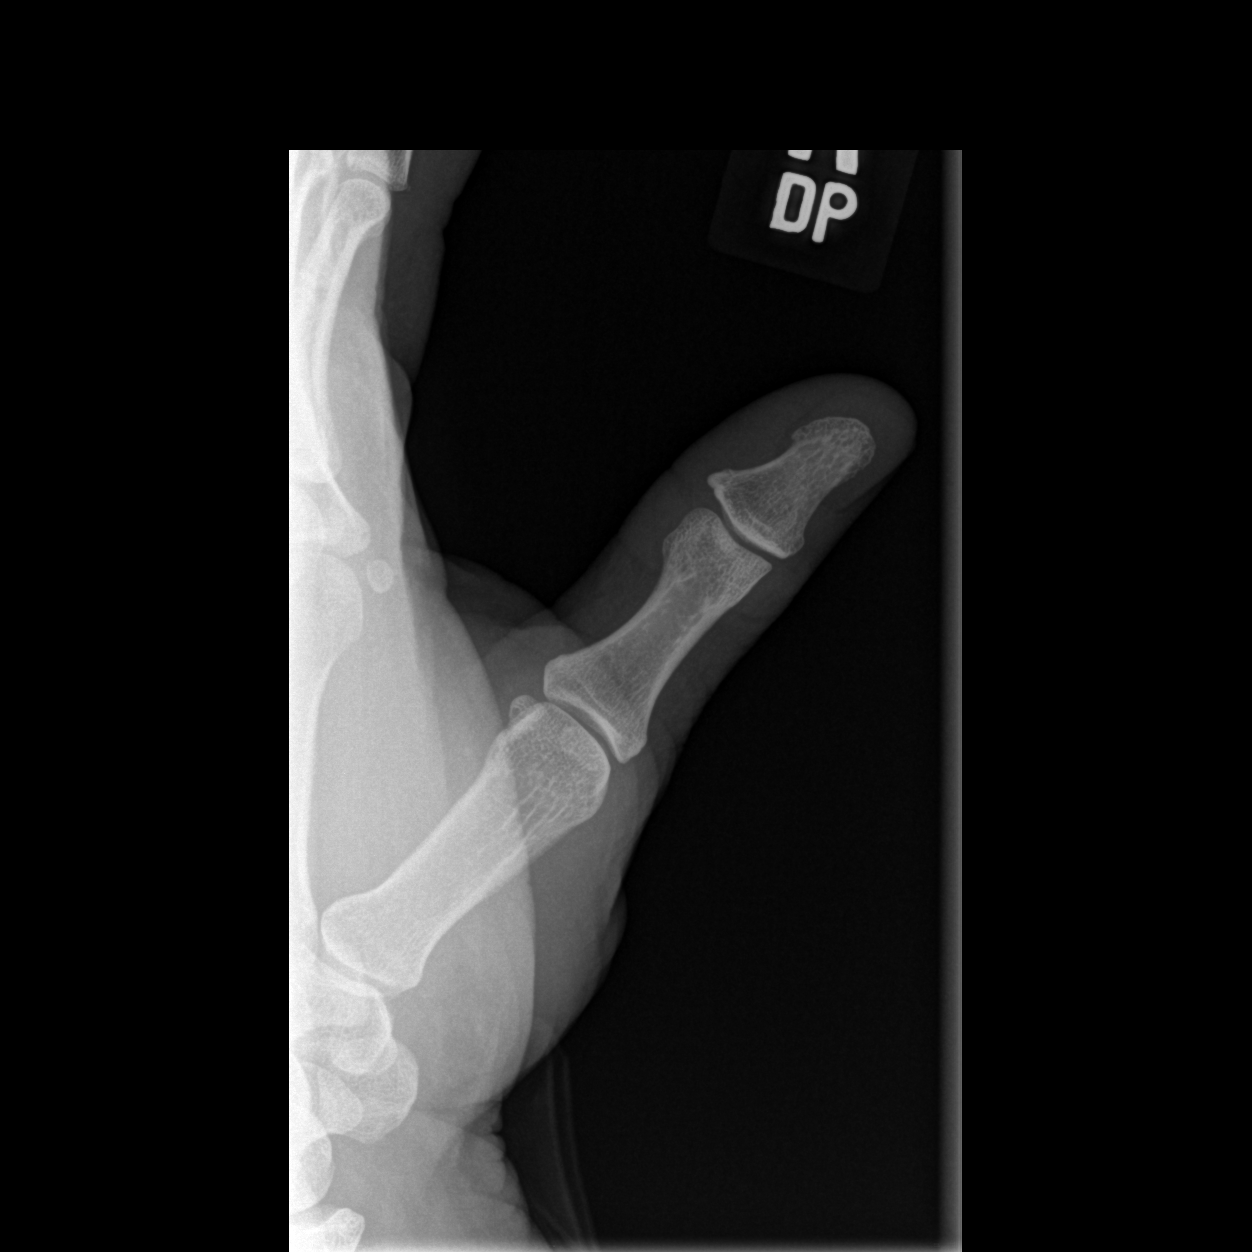

[x finger obl. right]
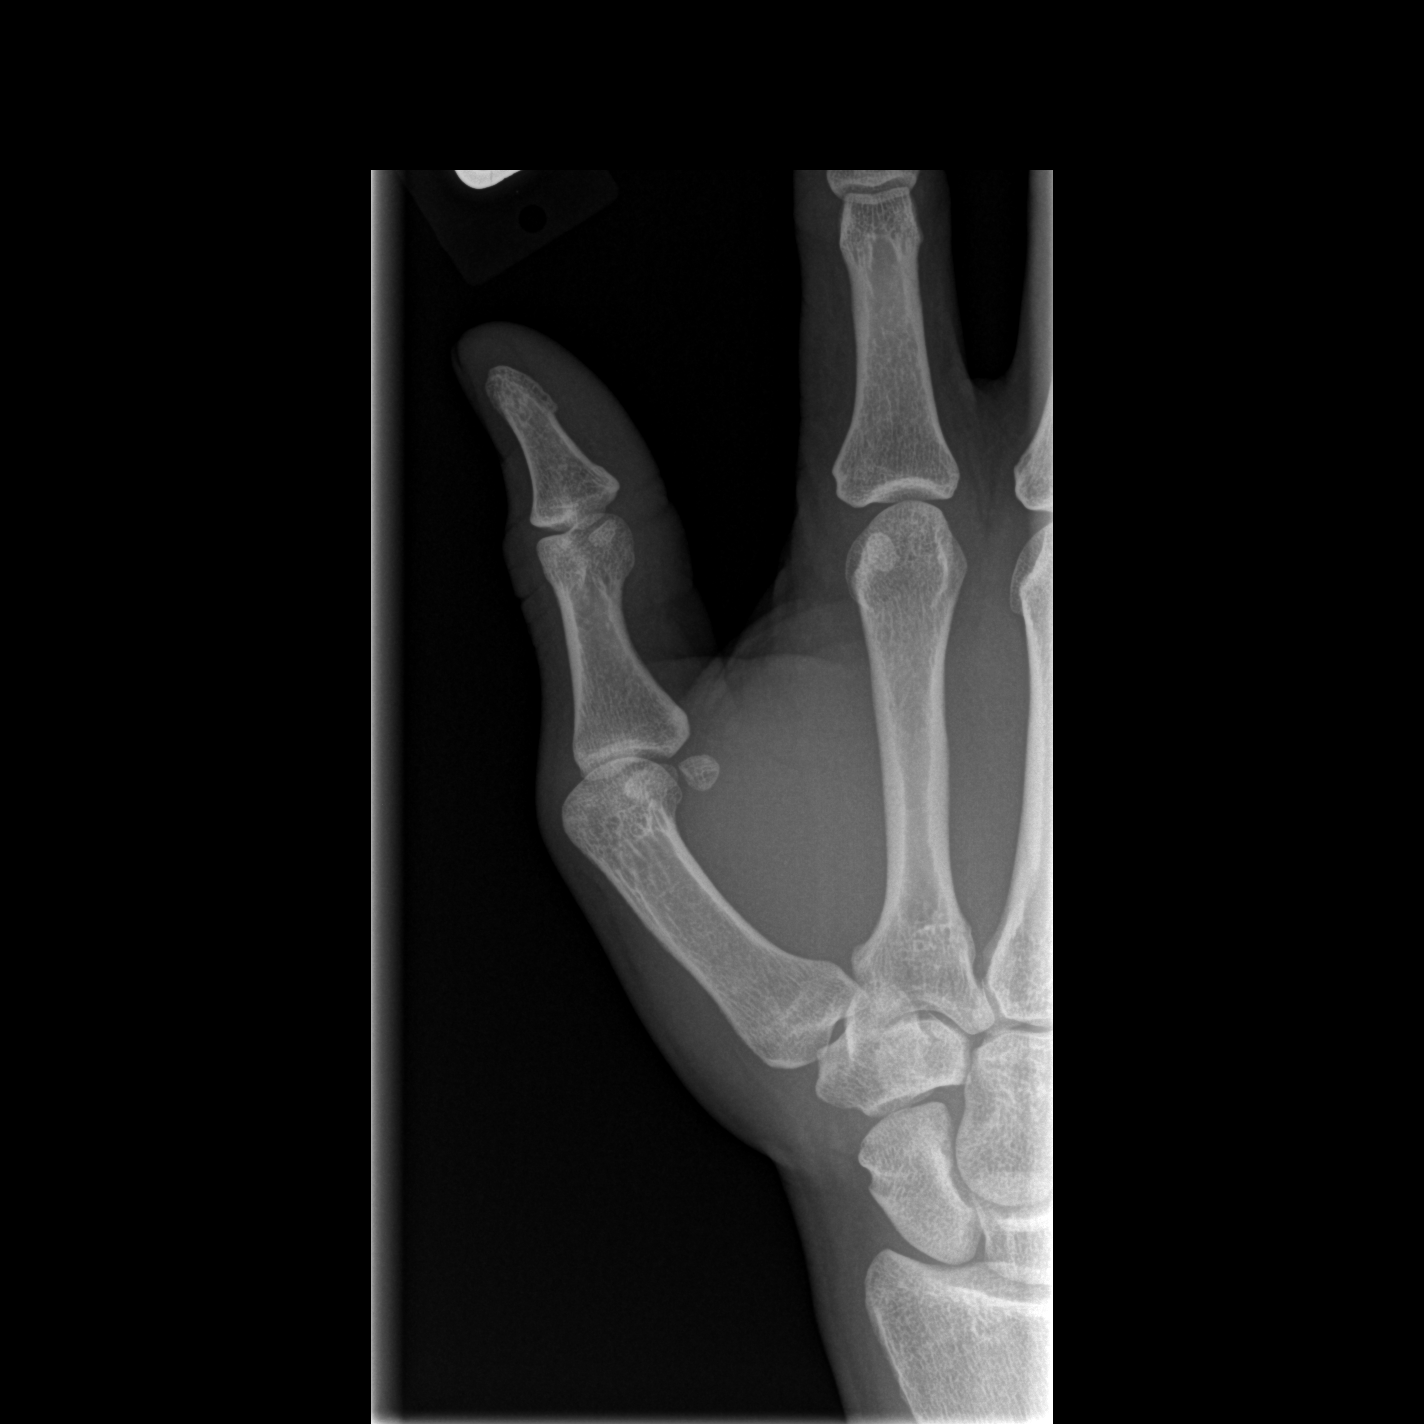

[x finger lateral right]
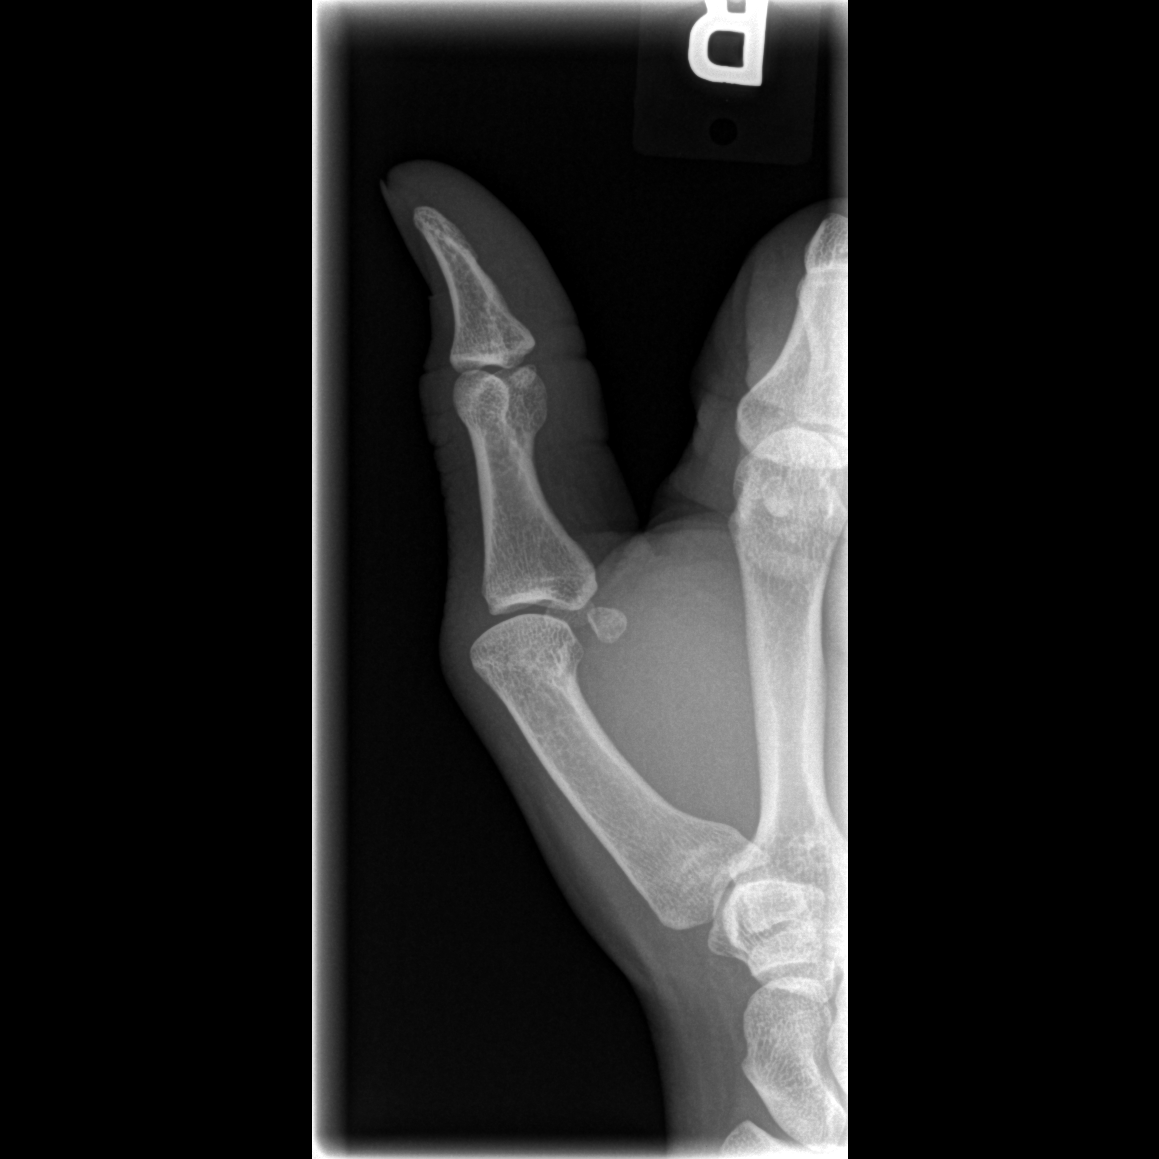

[3 of 3 positions shown; findings below may reference images not displayed]

FINDINGS: There is no evidence of fracture or dislocation. There is no
evidence of arthropathy or other focal bone abnormality. Soft
tissues are unremarkable
IMPRESSION: Normal right thumb.

## 2019-02-16 ENCOUNTER — Ambulatory Visit (HOSPITAL_COMMUNITY)
Admission: EM | Admit: 2019-02-16 | Discharge: 2019-02-16 | Disposition: A | Discharge status: Home / Self Care | Payer: Self-pay | Attending: Family Medicine | Admitting: Family Medicine

## 2019-02-16 ENCOUNTER — Ambulatory Visit (INDEPENDENT_AMBULATORY_CARE_PROVIDER_SITE_OTHER): Payer: Self-pay

## 2019-02-16 ENCOUNTER — Encounter (HOSPITAL_COMMUNITY): Payer: Self-pay

## 2019-02-16 ENCOUNTER — Other Ambulatory Visit: Payer: Self-pay

## 2019-02-16 DIAGNOSIS — L0291 Cutaneous abscess, unspecified: Secondary | ICD-10-CM

## 2019-02-16 MED ORDER — TRAMADOL HCL 50 MG PO TABS: 50.0000 mg | ORAL_TABLET | Freq: Two times a day (BID) | ORAL | 0 refills | Status: AC | PRN

## 2019-02-16 MED ORDER — IBUPROFEN 800 MG PO TABS: 800.0000 mg | ORAL_TABLET | Freq: Three times a day (TID) | ORAL | 0 refills | Status: DC

## 2019-02-16 MED ORDER — AMOXICILLIN-POT CLAVULANATE 875-125 MG PO TABS: 1.0000 | ORAL_TABLET | Freq: Two times a day (BID) | ORAL | 0 refills | Status: DC

## 2019-02-16 NOTE — Discharge Instructions (Addendum)
Your x ray was normal  I am starting you on some antibiotics and giving you something for the pain You need to continue the warm compresses a few times a day for 10 to 15 minutes at a time.  Please follow up for continued or worsening symptoms.

## 2019-02-16 NOTE — ED Provider Notes (Signed)
[place]    CSN: [id number] Arrival date & time: 02/16/19  1155     History   Chief Complaint Chief Complaint  Patient presents with  . Abscess    HPI Devin Marsh is a 33 y.o. male.   Patient is a 33 year old male that presents with abscess to dorsum of right hand that has been constant and worsening over last 3 days.  He has had opening of abscess with small amount of drainage.  He has been doing warm compresses.  Taking  Ibuprofen for the pain without much relief.  Describes abscess as very painful with erythema. Aching and throbbing.  Denies any injury to the hand.  Denies any IV drug use.  Denies any associated fevers, chills, bodies, night sweats.  ROS per HPI      Past Medical History:  Diagnosis Date  . Abscess 04/13/2013    Patient Active Problem List   Diagnosis Date Noted  . Non-traumatic rhabdomyolysis   . Rhabdomyolysis 05/11/2015  . Tobacco abuse 05/11/2015    Past Surgical History:  Procedure Laterality Date  . APPENDECTOMY    . INCISION AND DRAINAGE PERIRECTAL ABSCESS N/A 04/13/2013   Procedure: IRRIGATION AND DEBRIDEMENT PERIRECTAL ABSCESS;  Surgeon: [name], MD;  Location: [facility];  Service: General;  Laterality: N/A;       Home Medications    Prior to Admission medications   Medication Sig Start Date End Date Taking? Authorizing Provider  acetaminophen (TYLENOL) 500 MG tablet Take 500 mg by mouth every 6 (six) hours as needed.    [provider]  amoxicillin-clavulanate (AUGMENTIN) 875-125 MG tablet Take 1 tablet by mouth every 12 (twelve) hours. 02/16/19   Loura Halt A, NP  ibuprofen (ADVIL) 800 MG tablet Take 1 tablet (800 mg total) by mouth 3 (three) times daily. 02/16/19   Loura Halt A, NP  ibuprofen (ADVIL,MOTRIN) 200 MG tablet Take 200 mg by mouth every 6 (six) hours as needed.    [provider]  traMADol (ULTRAM) 50 MG tablet Take 1 tablet (50 mg total) by mouth every 12 (twelve) hours as needed  for up to 3 days. 02/16/19 02/19/19  Orvan July, NP    Family History Family History  Problem Relation Age of Onset  . Diabetes Mother     Social History Social History   Tobacco Use  . Smoking status: Former Smoker    Types: Cigarettes    Last attempt to quit: 10/29/2017    Years since quitting: 1.3  . Smokeless tobacco: Never Used  Substance Use Topics  . Alcohol use: No  . Drug use: No     Allergies   Patient has no known allergies.   Review of Systems Review of Systems   Physical Exam Triage Vital Signs ED Triage Vitals  Enc Vitals Group     BP 02/16/19 1223 134/90     Pulse Rate 02/16/19 1223 75     Resp 02/16/19 1223 17     Temp 02/16/19 1223 98.2 F (36.8 C)     Temp Source 02/16/19 1223 Oral     SpO2 02/16/19 1223 100 %     Weight --      Height --      Head Circumference --      Peak Flow --      Pain Score 02/16/19 1219 7     Pain Loc --      Pain Edu? --      Excl.  in [place]? --    No data found.  Updated Vital Signs BP 134/90 (BP Location: Right Arm)   Pulse 75   Temp 98.2 F (36.8 C) (Oral)   Resp 17   SpO2 100%   Visual Acuity Right Eye Distance:   Left Eye Distance:   Bilateral Distance:    Right Eye Near:   Left Eye Near:    Bilateral Near:     Physical Exam Vitals signs and nursing note reviewed.  Constitutional:      Appearance: Normal appearance.  HENT:     Head: Normocephalic and atraumatic.     Nose: Nose normal.     Mouth/Throat:     Pharynx: Oropharynx is clear.  Eyes:     Conjunctiva/sclera: Conjunctivae normal.  Neck:     Musculoskeletal: Normal range of motion.  Pulmonary:     Effort: Pulmonary effort is normal.  Musculoskeletal: Normal range of motion.  Skin:    General: Skin is warm and dry.          Comments: Abscess to the base of left metacarpals. Radial side, extending into the left wrist area.  Approximate 3 to 4 cm with opening in the center and pustular area.  Surrounding erythema and induration.   Very tender to touch.  Good flexion-extension of the wrist.  Neurological:     Mental Status: He is alert.  Psychiatric:        Mood and Affect: Mood normal.      UC Treatments / Results  Labs (all labs ordered are listed, but only abnormal results are displayed) Labs Reviewed - No data to display  EKG None  Radiology Dg Hand Complete Left  Result Date: 02/16/2019 CLINICAL DATA:  Focal swelling near the base of the third metacarpal. EXAM: LEFT HAND - COMPLETE 3+ VIEW COMPARISON:  None. FINDINGS: No acute fracture or dislocation is noted. Mild prominence of the soft tissue is noted along the dorsal aspect of the hand consistent with the known history. No radiopaque foreign body is seen. IMPRESSION: Soft tissue swelling consistent with the given clinical history. No acute bony abnormality is noted. Electronically Signed   By: Inez Catalina M.D.   On: 02/16/2019 13:11    Procedures Procedures (including critical care time)  Medications Ordered in UC Medications - No data to display  Initial Impression / Assessment and Plan / UC Course  I have reviewed the triage vital signs and the nursing notes.  Pertinent labs & imaging results that were available during my care of the patient were reviewed by me and considered in my medical decision making (see chart for details).      Abscess   X ray done to r/o osteomyelitis based on location and the amount of pain he is in.  X ray normal Will go ahead and treat with warm compresses, abx and pain meds.  Pt will barely let me touch the abscess. Refusing I&D currently.  He can flex and extend the wrist without issues.  Told to monitor for 24 to 48 hours and if no improvement or worsening symptoms he needs to follow-up promptly.  Final Clinical Impressions(s) / UC Diagnoses   Final diagnoses:  Abscess     Discharge Instructions     Your x ray was normal  I am starting you on some antibiotics and giving you something for the pain  You need to continue the warm compresses a few times a day for 10 to 15 minutes at a time.  Please follow up for continued or worsening symptoms.     ED Prescriptions    Medication Sig Dispense Auth. Provider   amoxicillin-clavulanate (AUGMENTIN) 875-125 MG tablet Take 1 tablet by mouth every 12 (twelve) hours. 14 tablet Shametra Cumberland A, NP   traMADol (ULTRAM) 50 MG tablet Take 1 tablet (50 mg total) by mouth every 12 (twelve) hours as needed for up to 3 days. 6 tablet Malayla Granberry A, NP   ibuprofen (ADVIL) 800 MG tablet Take 1 tablet (800 mg total) by mouth 3 (three) times daily. 21 tablet Loura Halt A, NP     Controlled Substance Prescriptions [place] Controlled Substance Registry consulted? Not Applicable   Orvan July, NP 02/16/19 1424

## 2019-02-16 NOTE — ED Triage Notes (Signed)
Patient presents to Urgent Care with complaints of right hand abscess since 3 days ago. Patient states he thinks something bit him and he popped a bump, which has progressed into a pus-filled wound, his wrist and hand surrounding the area.

## 2019-02-20 ENCOUNTER — Ambulatory Visit (HOSPITAL_COMMUNITY)
Admission: EM | Admit: 2019-02-20 | Discharge: 2019-02-20 | Disposition: A | Discharge status: Home / Self Care | Payer: Self-pay

## 2019-02-20 ENCOUNTER — Encounter (HOSPITAL_COMMUNITY): Payer: Self-pay

## 2019-02-20 ENCOUNTER — Emergency Department (HOSPITAL_COMMUNITY)
Admission: EM | Admit: 2019-02-20 | Discharge: 2019-02-20 | Disposition: A | Discharge status: Home / Self Care | Payer: Self-pay | Attending: Emergency Medicine | Admitting: Emergency Medicine

## 2019-02-20 ENCOUNTER — Encounter (HOSPITAL_COMMUNITY): Payer: Self-pay | Admitting: *Deleted

## 2019-02-20 ENCOUNTER — Other Ambulatory Visit: Payer: Self-pay

## 2019-02-20 DIAGNOSIS — L0291 Cutaneous abscess, unspecified: Secondary | ICD-10-CM

## 2019-02-20 DIAGNOSIS — Z5321 Procedure and treatment not carried out due to patient leaving prior to being seen by health care provider: Secondary | ICD-10-CM | POA: Insufficient documentation

## 2019-02-20 DIAGNOSIS — R2232 Localized swelling, mass and lump, left upper limb: Secondary | ICD-10-CM | POA: Insufficient documentation

## 2019-02-20 NOTE — ED Triage Notes (Signed)
Pt presents with abscess on back of left hand that has progressively gotten worse since starting antibiotics on Monday.  Area id oozing pus and  Has significant swellling.

## 2019-02-20 NOTE — ED Triage Notes (Signed)
The pt has a swollen area lt hand that has been there for 8 days he has been seen at urged[facility] care and was given an antibiotic.  Open lesion dorsal hand

## 2019-02-20 NOTE — ED Triage Notes (Signed)
He was seen at urgent care today and sent here

## 2019-02-20 NOTE — ED Provider Notes (Signed)
[place]    CSN: [id number] Arrival date & time: 02/20/19  1120     History   Chief Complaint Chief Complaint  Patient presents with  . Abscess    HPI Devin Marsh is a 33 y.o. male.   Devin Marsh presents with complaints of worsening abscess to dorsum of left hand. Started approximately 1 week ago. Was seen here 4/20 and declined incision and drainage and was started on Augmentin. Pain and swelling has since worsened. Still with purulent drainage. Pain to wrist and to hand. No fevers. Very tender. Has had I&D for perirectal abscess in the past but no other similar. No known MRSA history. Pain with movement of the hand and the wrist. No numbness or tingling. Denies any IV drug use. Xray completed on 4/20 without evidence of osteomyelitis.    ROS per HPI, negative if not otherwise mentioned.      Past Medical History:  Diagnosis Date  . Abscess 04/13/2013    Patient Active Problem List   Diagnosis Date Noted  . Non-traumatic rhabdomyolysis   . Rhabdomyolysis 05/11/2015  . Tobacco abuse 05/11/2015    Past Surgical History:  Procedure Laterality Date  . APPENDECTOMY    . INCISION AND DRAINAGE PERIRECTAL ABSCESS N/A 04/13/2013   Procedure: IRRIGATION AND DEBRIDEMENT PERIRECTAL ABSCESS;  Surgeon: [name], MD;  Location: [facility];  Service: General;  Laterality: N/A;       Home Medications    Prior to Admission medications   Medication Sig Start Date End Date Taking? Authorizing Provider  acetaminophen (TYLENOL) 500 MG tablet Take 500 mg by mouth every 6 (six) hours as needed.    [provider]  amoxicillin-clavulanate (AUGMENTIN) 875-125 MG tablet Take 1 tablet by mouth every 12 (twelve) hours. 02/16/19   Loura Halt A, NP  ibuprofen (ADVIL) 800 MG tablet Take 1 tablet (800 mg total) by mouth 3 (three) times daily. 02/16/19   Loura Halt A, NP  ibuprofen (ADVIL,MOTRIN) 200 MG tablet Take 200 mg by mouth every 6 (six) hours as needed.     [provider]    Family History Family History  Problem Relation Age of Onset  . Diabetes Mother     Social History Social History   Tobacco Use  . Smoking status: Former Smoker    Types: Cigarettes    Last attempt to quit: 10/29/2017    Years since quitting: 1.3  . Smokeless tobacco: Never Used  Substance Use Topics  . Alcohol use: No  . Drug use: No     Allergies   Patient has no known allergies.   Review of Systems Review of Systems   Physical Exam Triage Vital Signs ED Triage Vitals [02/20/19 1130]  Enc Vitals Group     BP 122/75     Pulse Rate 77     Resp 18     Temp 98.2 F (36.8 C)     Temp Source Oral     SpO2 95 %     Weight      Height      Head Circumference      Peak Flow      Pain Score 10     Pain Loc      Pain Edu?      Excl. in [place]?    No data found.  Updated Vital Signs BP 122/75 (BP Location: Left Arm)   Pulse 77   Temp 98.2 F (36.8 C) (Oral)   Resp 18  SpO2 95%    Physical Exam Constitutional:      Appearance: He is well-developed.  Cardiovascular:     Rate and Rhythm: Normal rate and regular rhythm.  Pulmonary:     Effort: Pulmonary effort is normal.     Breath sounds: Normal breath sounds.  Musculoskeletal:     Left hand: He exhibits decreased range of motion and tenderness.     Comments: Very tender fluctuant abscess to proximal dorsum of left hand with bloody purulent drainage; tender to hand and wrist with swelling present; pain with ROM of fingers into the hand, without specific finger tenderness; cap refill < 2 seconds    Skin:    General: Skin is warm and dry.  Neurological:     Mental Status: He is alert and oriented to person, place, and time.        UC Treatments / Results  Labs (all labs ordered are listed, but only abnormal results are displayed) Labs Reviewed - No data to display  EKG None  Radiology No results found.  Procedures Procedures (including critical care time)   Medications Ordered in UC Medications - No data to display  Initial Impression / Assessment and Plan / UC Course  I have reviewed the triage vital signs and the nursing notes.  Pertinent labs & imaging results that were available during my care of the patient were reviewed by me and considered in my medical decision making (see chart for details).     Large abscess to dorsum to left hand, worsening despite initiation of Augmentin outpatient. Feel concerned that this needs more thorough incision and drainage than what I am capable of providing here in UC. Spoke to on-call hand surgeon Dr. Grandville Silos who recommended patient to go to ER if unable to adequately treat here in UC and to notify him if surgical need. Patient is afebrile. Agreeable to more thorough treatment in ER at this time. Safe for self transport.   Final Clinical Impressions(s) / UC Diagnoses   Final diagnoses:  Abscess     Discharge Instructions     Please go to ER for further evaluation and treatment.     ED Prescriptions    None     Controlled Substance Prescriptions [place] Controlled Substance Registry consulted? Not Applicable   Zigmund Gottron, NP 02/20/19 1201

## 2019-02-20 NOTE — ED Notes (Signed)
Pt called for multiple times, pt DC LWBS

## 2019-02-20 NOTE — Discharge Instructions (Signed)
Please go to ER for further evaluation and treatment.

## 2019-02-20 NOTE — ED Notes (Signed)
Called pt to come back to exam room. No response.

## 2020-03-03 ENCOUNTER — Encounter: Payer: Self-pay | Admitting: Family Medicine

## 2020-03-16 NOTE — Progress Notes (Deleted)
   Subjective:    Patient ID: Devin Marsh, male    DOB: [birth year], 34 y.o.   MRN: [id number]  HPI No chief complaint on file.  He is new to the practice and here for a complete physical exam. Previous medical care: Last CPE:  Other providers:  Past medical history: Surgeries:  Family history: Mental health history:  Social history: Lives with ***, works as ***,  *** Smoking, drinking alcohol, drug use Diet: *** Exercise: ***  Immunizations:  Health maintenance:  Colonoscopy: Last PSA: Last Dental Exam: Last Eye Exam:  Wears seatbelt always, uses sunscreen, smoke detectors in home and functioning, does not text while driving, feels safe in home environment.  Reviewed allergies, medications, past medical, surgical, family, and social history.     Review of Systems Review of Systems Constitutional: -fever, -chills, -sweats, -unexpected weight change,-fatigue ENT: -runny nose, -ear pain, -sore throat Cardiology:  -chest pain, -palpitations, -edema Respiratory: -cough, -shortness of breath, -wheezing Gastroenterology: -abdominal pain, -nausea, -vomiting, -diarrhea, -constipation  Hematology: -bleeding or bruising problems Musculoskeletal: -arthralgias, -myalgias, -joint swelling, -back pain Ophthalmology: -vision changes Urology: -dysuria, -difficulty urinating, -hematuria, -urinary frequency, -urgency Neurology: -headache, -weakness, -tingling, -numbness       Objective:   Physical Exam There were no vitals taken for this visit.  General Appearance:    Alert, cooperative, no distress, appears stated age  Head:    Normocephalic, without obvious abnormality, atraumatic  Eyes:    PERRL, conjunctiva/corneas clear, EOM's intact, fundi    benign  Ears:    Normal TM's and external ear canals  Nose:   Nares normal, mucosa normal, no drainage or sinus   tenderness  Throat:   Lips, mucosa, and tongue normal; teeth and gums normal  Neck:   Supple, no  lymphadenopathy;  thyroid:  no   enlargement/tenderness/nodules; no carotid   bruit or JVD  Back:    Spine nontender, no curvature, ROM normal, no CVA     tenderness  Lungs:     Clear to auscultation bilaterally without wheezes, rales or     ronchi; respirations unlabored  Chest Wall:    No tenderness or deformity   Heart:    Regular rate and rhythm, S1 and S2 normal, no murmur, rub   or gallop  Breast Exam:    No chest wall tenderness, masses or gynecomastia  Abdomen:     Soft, non-tender, nondistended, normoactive bowel sounds,    no masses, no hepatosplenomegaly  Genitalia:    Normal male external genitalia without lesions.  Testicles without masses.  No inguinal hernias.  Rectal:   Deferred due to age <40 and lack of symptoms  Extremities:   No clubbing, cyanosis or edema  Pulses:   2+ and symmetric all extremities  Skin:   Skin color, texture, turgor normal, no rashes or lesions  Lymph nodes:   Cervical, supraclavicular, and axillary nodes normal  Neurologic:   CNII-XII intact, normal strength, sensation and gait; reflexes 2+ and symmetric throughout          Psych:   Normal mood, affect, hygiene and grooming.         Assessment & Plan:  Routine general medical examination at a health care facility  Tobacco abuse

## 2020-03-17 ENCOUNTER — Encounter: Payer: Self-pay | Admitting: Family Medicine

## 2020-05-11 ENCOUNTER — Telehealth: Payer: Self-pay | Admitting: General Practice

## 2020-05-11 NOTE — Telephone Encounter (Signed)
Pt called and wanted to schedule appt. He no showed his New Pt Cpe appt with you in May. He said he got called in to work that day and couldn't make it

## 2020-05-11 NOTE — Telephone Encounter (Signed)
Unfortunately I am not able to do this per our policy.

## 2020-05-30 ENCOUNTER — Other Ambulatory Visit: Payer: Self-pay

## 2020-05-30 ENCOUNTER — Encounter (INDEPENDENT_AMBULATORY_CARE_PROVIDER_SITE_OTHER): Payer: Self-pay | Admitting: Primary Care

## 2020-05-30 ENCOUNTER — Ambulatory Visit (INDEPENDENT_AMBULATORY_CARE_PROVIDER_SITE_OTHER): Payer: No Typology Code available for payment source | Admitting: Primary Care

## 2020-05-30 VITALS — BP 130/80 | HR 100 | Temp 98.5°F | Ht 69.0 in | Wt 254.8 lb

## 2020-05-30 DIAGNOSIS — Z23 Encounter for immunization: Secondary | ICD-10-CM

## 2020-05-30 DIAGNOSIS — K921 Melena: Secondary | ICD-10-CM

## 2020-05-30 DIAGNOSIS — Z833 Family history of diabetes mellitus: Secondary | ICD-10-CM | POA: Diagnosis not present

## 2020-05-30 DIAGNOSIS — R030 Elevated blood-pressure reading, without diagnosis of hypertension: Secondary | ICD-10-CM | POA: Diagnosis not present

## 2020-05-30 DIAGNOSIS — Z113 Encounter for screening for infections with a predominantly sexual mode of transmission: Secondary | ICD-10-CM

## 2020-05-30 DIAGNOSIS — Z7689 Persons encountering health services in other specified circumstances: Secondary | ICD-10-CM

## 2020-05-30 DIAGNOSIS — Z6837 Body mass index (BMI) 37.0-37.9, adult: Secondary | ICD-10-CM

## 2020-05-30 LAB — POCT GLYCOSYLATED HEMOGLOBIN (HGB A1C): Hemoglobin A1C: 5.8 % — AB (ref 4.0–5.6)

## 2020-05-30 NOTE — Progress Notes (Signed)
Pt would like to be checked for colon cancer

## 2020-05-30 NOTE — Progress Notes (Signed)
New Patient Office Visit  Subjective:  Patient ID: Devin Marsh, male    DOB: Aug 02, 1986  Age: 34 y.o. MRN: [id number]  CC:  Chief Complaint  Patient presents with  . New Patient (Initial Visit)    referral to dermatology     HPI Mr. Devin Marsh is a 34 year old male who presents for establishment of care and concerns with blood in stool requesting to see a gastrologist.  Patient has no family history of colon cancer referral will be made.  He also has some discoloration between his legs.  Past Medical History:  Diagnosis Date  . Abscess 04/13/2013    Past Surgical History:  Procedure Laterality Date  . APPENDECTOMY    . INCISION AND DRAINAGE PERIRECTAL ABSCESS N/A 04/13/2013   Procedure: IRRIGATION AND DEBRIDEMENT PERIRECTAL ABSCESS;  Surgeon: [name], MD;  Location: [facility];  Service: General;  Laterality: N/A;    Family History  Problem Relation Age of Onset  . Diabetes Mother     Social History   Socioeconomic History  . Marital status: Single    Spouse name: Not on file  . Number of children: Not on file  . Years of education: Not on file  . Highest education level: Not on file  Occupational History  . Not on file  Tobacco Use  . Smoking status: Former Smoker    Types: Cigarettes    Quit date: 10/29/2017    Years since quitting: 2.5  . Smokeless tobacco: Never Used  Vaping Use  . Vaping Use: Some days  . Substances: Nicotine, Flavoring  Substance and Sexual Activity  . Alcohol use: No  . Drug use: No  . Sexual activity: Yes  Other Topics Concern  . Not on file  Social History Narrative  . Not on file   Social Determinants of Health   Financial Resource Strain:   . Difficulty of Paying Living Expenses:   Food Insecurity:   . Worried About Charity fundraiser in the Last Year:   . Arboriculturist in the Last Year:   Transportation Needs:   . Film/video editor (Medical):   Marland Kitchen Lack of Transportation (Non-Medical):   Physical  Activity:   . Days of Exercise per Week:   . Minutes of Exercise per Session:   Stress:   . Feeling of Stress :   Social Connections:   . Frequency of Communication with Friends and Family:   . Frequency of Social Gatherings with Friends and Family:   . Attends Religious Services:   . Active Member of Clubs or Organizations:   . Attends Archivist Meetings:   Marland Kitchen Marital Status:   Intimate Partner Violence:   . Fear of Current or Ex-Partner:   . Emotionally Abused:   Marland Kitchen Physically Abused:   . Sexually Abused:     ROS Review of Systems  Gastrointestinal: Positive for blood in stool.  Skin: Positive for color change and rash.       Legs  All other systems reviewed and are negative.   Objective:   Today's Vitals: BP (!) 130/80 (BP Location: Left Arm, Patient Position: Sitting, Cuff Size: Large)   Pulse 100   Temp 98.5 F (36.9 C) (Oral)   Ht 5' 9" (1.753 m)   Wt (!) 254 lb 12.8 oz (115.6 kg)   SpO2 94%   BMI 37.63 kg/m   Physical Exam Vitals reviewed.  Constitutional:      Appearance: He is obese.  HENT:     Head: Normocephalic.     Nose: Nose normal.  Cardiovascular:     Rate and Rhythm: Normal rate and regular rhythm.     Pulses: Normal pulses.     Heart sounds: Normal heart sounds.  Pulmonary:     Effort: Pulmonary effort is normal.     Breath sounds: Normal breath sounds.  Abdominal:     General: Bowel sounds are normal. There is distension.  Musculoskeletal:        General: Normal range of motion.     Cervical back: Normal range of motion and neck supple.  Skin:    General: Skin is warm and dry.  Neurological:     Mental Status: He is alert.     Assessment & Plan:  Devin Marsh was seen today for new patient (initial visit).  Diagnoses and all orders for this visit:  Encounter to establish care Devin Mire, NP-C will be your  (PCP) she is mastered prepared . Able to diagnosed and treatment also  answer health concern as well as  continuing care of varied medical conditions, not limited by cause, organ system, or diagnosis.   Screening for STD (sexually transmitted disease) -     Hepatitis C Antibody -     HIV Antibody (routine testing w rflx) -     RPR  Blood in stool -     Ambulatory referral to Gastroenterology  Blood pressure elevated without history of HTN Discussed reading labels monitoring sodium content, fermented food, chips and exercising losing weight can also contribute to lowering blood pressure -     CMP14+EGFR  Family history of diabetes mellitus (DM) -     CBC with Differential -     HgB A1c 5.8  Discussed A1c 5.8 is prediabetes 5.7-6.4 monitor carbohydrate to include potatoes, rice, breads, sweets, and sodas   class 2 severe obesity due to excess calories with serious comorbidity and body mass index (BMI) of 37.0 to 37.9 in adult [facility]) Obesity is 30-39 indicating an excess in caloric intake or underlining conditions. This may lead to other co-morbidities.  Examples provided were hypertension, diabetes, and respiratory complications lifestyle modifications of diet and exercise may reduce obesity.  -     Lipid Panel  Need for Tdap vaccination Tdap is recommended every 10 years for adults by the CDC. -     Tdap vaccine greater than or equal to 7yo IM    Outpatient Encounter Medications as of 05/30/2020  Medication Sig  . [DISCONTINUED] acetaminophen (TYLENOL) 500 MG tablet Take 500 mg by mouth every 6 (six) hours as needed.  . [DISCONTINUED] amoxicillin-clavulanate (AUGMENTIN) 875-125 MG tablet Take 1 tablet by mouth every 12 (twelve) hours.  . [DISCONTINUED] ibuprofen (ADVIL) 800 MG tablet Take 1 tablet (800 mg total) by mouth 3 (three) times daily.  . [DISCONTINUED] ibuprofen (ADVIL,MOTRIN) 200 MG tablet Take 200 mg by mouth every 6 (six) hours as needed.   No facility-administered encounter medications on file as of 05/30/2020.    Follow-up: Return for Schedule appointment after seen by  gastrology.   Devin Perna, NP

## 2020-05-31 ENCOUNTER — Other Ambulatory Visit (INDEPENDENT_AMBULATORY_CARE_PROVIDER_SITE_OTHER): Payer: Self-pay | Admitting: Primary Care

## 2020-05-31 ENCOUNTER — Encounter: Payer: Self-pay | Admitting: Gastroenterology

## 2020-05-31 LAB — CBC WITH DIFFERENTIAL/PLATELET
Basophils Absolute: 0.1 10*3/uL (ref 0.0–0.2)
Basos: 1 %
EOS (ABSOLUTE): 0.4 10*3/uL (ref 0.0–0.4)
Eos: 3 %
Hematocrit: 45.8 % (ref 37.5–51.0)
Hemoglobin: 15.3 g/dL (ref 13.0–17.7)
Immature Grans (Abs): 0 10*3/uL (ref 0.0–0.1)
Immature Granulocytes: 0 %
Lymphocytes Absolute: 3.3 10*3/uL — ABNORMAL HIGH (ref 0.7–3.1)
Lymphs: 32 %
MCH: 29.8 pg (ref 26.6–33.0)
MCHC: 33.4 g/dL (ref 31.5–35.7)
MCV: 89 fL (ref 79–97)
Monocytes Absolute: 0.6 10*3/uL (ref 0.1–0.9)
Monocytes: 6 %
Neutrophils Absolute: 6 10*3/uL (ref 1.4–7.0)
Neutrophils: 58 %
Platelets: 284 10*3/uL (ref 150–450)
RBC: 5.13 x10E6/uL (ref 4.14–5.80)
RDW: 13.4 % (ref 11.6–15.4)
WBC: 10.3 10*3/uL (ref 3.4–10.8)

## 2020-05-31 LAB — CMP14+EGFR
ALT: 35 IU/L (ref 0–44)
AST: 23 IU/L (ref 0–40)
Albumin/Globulin Ratio: 1.6 (ref 1.2–2.2)
Albumin: 4.4 g/dL (ref 4.0–5.0)
Alkaline Phosphatase: 103 IU/L (ref 48–121)
BUN/Creatinine Ratio: 7 — ABNORMAL LOW (ref 9–20)
BUN: 7 mg/dL (ref 6–20)
Bilirubin Total: <0.2 mg/dL (ref 0.0–1.2)
CO2: 23 mmol/L (ref 20–29)
Calcium: 9.2 mg/dL (ref 8.7–10.2)
Chloride: 103 mmol/L (ref 96–106)
Creatinine, Ser: 1.01 mg/dL (ref 0.76–1.27)
GFR calc Af Amer: 110 mL/min/{1.73_m2} (ref >59)
GFR calc non Af Amer: 95 mL/min/{1.73_m2} (ref >59)
Globulin, Total: 2.7 g/dL (ref 1.5–4.5)
Glucose: 116 mg/dL — ABNORMAL HIGH (ref 65–99)
Potassium: 4.2 mmol/L (ref 3.5–5.2)
Sodium: 138 mmol/L (ref 134–144)
Total Protein: 7.1 g/dL (ref 6.0–8.5)

## 2020-05-31 LAB — LIPID PANEL
Chol/HDL Ratio: 3.3 ratio (ref 0.0–5.0)
Cholesterol, Total: 197 mg/dL (ref 100–199)
HDL: 60 mg/dL (ref >39)
LDL Chol Calc (NIH): 112 mg/dL — ABNORMAL HIGH (ref 0–99)
Triglycerides: 140 mg/dL (ref 0–149)
VLDL Cholesterol Cal: 25 mg/dL (ref 5–40)

## 2020-05-31 LAB — HEPATITIS C ANTIBODY: Hep C Virus Ab: <0.1 s/co ratio (ref 0.0–0.9)

## 2020-05-31 LAB — RPR: RPR Ser Ql: NONREACTIVE

## 2020-05-31 LAB — HIV ANTIBODY (ROUTINE TESTING W REFLEX): HIV Screen 4th Generation wRfx: NONREACTIVE

## 2020-05-31 MED ORDER — ATORVASTATIN CALCIUM 10 MG PO TABS: 10.0000 mg | ORAL_TABLET | Freq: Every day | ORAL | 3 refills | Status: DC

## 2020-06-15 ENCOUNTER — Encounter: Payer: Self-pay | Admitting: Gastroenterology

## 2020-06-15 ENCOUNTER — Other Ambulatory Visit: Payer: Self-pay | Admitting: Gastroenterology

## 2020-06-15 ENCOUNTER — Ambulatory Visit: Payer: No Typology Code available for payment source | Admitting: Gastroenterology

## 2020-06-15 VITALS — BP 124/88 | HR 90 | Ht 69.0 in | Wt 254.5 lb

## 2020-06-15 DIAGNOSIS — K625 Hemorrhage of anus and rectum: Secondary | ICD-10-CM

## 2020-06-15 DIAGNOSIS — L290 Pruritus ani: Secondary | ICD-10-CM

## 2020-06-15 MED ORDER — SUTAB 1479-225-188 MG PO TABS: 1.0000 | ORAL_TABLET | Freq: Once | ORAL | 0 refills | Status: AC

## 2020-06-15 MED ORDER — HYDROCORTISONE (PERIANAL) 2.5 % EX CREA
1.0000 | TOPICAL_CREAM | Freq: Two times a day (BID) | CUTANEOUS | 1 refills | Status: DC
Start: 2020-06-15 — End: 2021-06-21

## 2020-06-15 NOTE — Patient Instructions (Signed)
If you are age 36 or older, your body mass index should be between 23-30. Your Body mass index is 37.58 kg/m. If this is out of the aforementioned range listed, please consider follow up with your Primary Care Provider.  If you are age 41 or younger, your body mass index should be between 19-25. Your Body mass index is 37.58 kg/m. If this is out of the aformentioned range listed, please consider follow up with your Primary Care Provider.   We have sent the following medications to your pharmacy for you to pick up at your convenience: Hydrocortisone cream 2.5 % three times daily to affected area.  You have been scheduled for a colonoscopy. Please follow written instructions given to you at your visit today.  Please pick up your prep supplies at the pharmacy within the next 1-3 days. If you use inhalers (even only as needed), please bring them with you on the day of your procedure.  Due to recent changes in healthcare laws, you may see the results of your imaging and laboratory studies on MyChart before your provider has had a chance to review them.  We understand that in some cases there may be results that are confusing or concerning to you. Not all laboratory results come back in the same time frame and the provider may be waiting for multiple results in order to interpret others.  Please give [place] 48 hours in order for your provider to thoroughly review all the results before contacting the office for clarification of your results.

## 2020-06-15 NOTE — Progress Notes (Signed)
06/15/2020 Devin Marsh [id number] 1986/01/28   HISTORY OF PRESENT ILLNESS: This is a 34 year old male who is new to our office.  He has been referred here by his PCP, Juluis Mire, NP, for evaluation regarding "blood in stool".  He tells me that he has seen blood on the toilet paper and in the toilet bowl intermittently for years.  He says that he mostly has normal bowel movements, but occasionally has constipation.  Sometimes has cramping lower abdominal pain.  He also complains of itching in his perianal area and what sounds like his gluteal cleft as well as in his groin.  He says that he has used several over-the-counter regimens on this in the past without much improvement.  He directly asked to have a colonoscopy.  Has history of I&D of perirectal abscess in 2014.   Past Medical History:  Diagnosis Date  . Abscess 04/13/2013   Past Surgical History:  Procedure Laterality Date  . APPENDECTOMY    . INCISION AND DRAINAGE PERIRECTAL ABSCESS N/A 04/13/2013   Procedure: IRRIGATION AND DEBRIDEMENT PERIRECTAL ABSCESS;  Surgeon: [name], MD;  Location: [facility];  Service: General;  Laterality: N/A;    reports that he quit smoking about 2 years ago. His smoking use included cigarettes. He has never used smokeless tobacco. He reports that he does not drink alcohol and does not use drugs. family history includes Asthma in his brother; Diabetes in his mother. No Known Allergies    Outpatient Encounter Medications as of 06/15/2020  Medication Sig  . atorvastatin (LIPITOR) 10 MG tablet Take 1 tablet (10 mg total) by mouth daily. (Patient not taking: Reported on 06/15/2020)   No facility-administered encounter medications on file as of 06/15/2020.    REVIEW OF SYSTEMS  : All other systems reviewed and negative except where noted in the History of Present Illness.   PHYSICAL EXAM: BP 124/88 (BP Location: Right Arm, Patient Position: Sitting, Cuff Size: Normal)   Pulse 90   Ht 5'  9" (1.753 m)   Wt 254 lb 8 oz (115.4 kg)   BMI 37.58 kg/m  General: Well developed AA male in no acute distress Head: Normocephalic and atraumatic Eyes:  Sclerae anicteric, conjunctiva pink. Ears: Normal auditory acuity Lungs: Clear throughout to auscultation; no increased WOB. Heart: Regular rate and rhythm; no M/R/G. Abdomen: Soft, non-distended.  BS present.  Mild lower abdominal TTP. Rectal: No external hemorrhoids noted.  No definite rash noted in the perianal area, but there was some excoriations noted that extended into the gluteal cleft, likely from scratching/trauma.  DRE not completed/limited due to discomfort although no definite fissure was seen or felt on the extent of my exam today. Musculoskeletal: Symmetrical with no gross deformities  Skin: No lesions on visible extremities Extremities: No edema  Neurological: Alert oriented x 4, grossly non-focal Psychological:  Alert and cooperative. Normal mood and affect  ASSESSMENT AND PLAN: *Rectal bleeding: No external hemorrhoids noted on exam today.  DRE was somewhat limited and not completed due to discomfort, although no definite fissure was seen or felt on the extent of my exam today.  Patient is wanting colonoscopy.  We will schedule with Dr. Fuller Plan.  He has had rectal bleeding in the toilet bowl, on the toilet paper, etc. intermittently for years.  The risks, benefits, and alternatives to colonoscopy were discussed with the patient and he consents to proceed.  *Perianal itching: I do not see any definite rash per se that I can tell.  There are definitely areas of excoriation in the perianal area and extending into the gluteal cleft.  Not directly on or near the anus per se.  I think the excoriations are likely from him scratching.  He also describes itching in his groin area.  Question if it is all fungal.  I will prescribe hydrocortisone cream for him to use and apply to the affected area.  If this does not help then he should see  PCP or dermatology for treatment of possible fungal source, etc.  Prescription sent to pharmacy.   CC:  Kerin Perna, NP

## 2020-06-15 NOTE — Progress Notes (Signed)
Reviewed and agree with management plan.  Stalin Gruenberg T. Ladeana Laplant, MD FACG [facility]  

## 2020-06-24 ENCOUNTER — Encounter (INDEPENDENT_AMBULATORY_CARE_PROVIDER_SITE_OTHER): Payer: Self-pay | Admitting: Primary Care

## 2020-06-24 ENCOUNTER — Other Ambulatory Visit: Payer: Self-pay

## 2020-06-24 ENCOUNTER — Encounter (INDEPENDENT_AMBULATORY_CARE_PROVIDER_SITE_OTHER): Payer: Self-pay

## 2020-06-24 ENCOUNTER — Ambulatory Visit (INDEPENDENT_AMBULATORY_CARE_PROVIDER_SITE_OTHER): Payer: No Typology Code available for payment source | Admitting: Primary Care

## 2020-06-24 VITALS — BP 136/93 | HR 85 | Ht 69.0 in | Wt 251.4 lb

## 2020-06-24 DIAGNOSIS — Z720 Tobacco use: Secondary | ICD-10-CM

## 2020-06-24 DIAGNOSIS — K625 Hemorrhage of anus and rectum: Secondary | ICD-10-CM | POA: Diagnosis not present

## 2020-06-24 DIAGNOSIS — M79642 Pain in left hand: Secondary | ICD-10-CM | POA: Diagnosis not present

## 2020-06-24 DIAGNOSIS — F4323 Adjustment disorder with mixed anxiety and depressed mood: Secondary | ICD-10-CM | POA: Diagnosis not present

## 2020-06-24 MED ORDER — CELECOXIB 100 MG PO CAPS: 100.0000 mg | ORAL_CAPSULE | Freq: Two times a day (BID) | ORAL | 0 refills | Status: DC

## 2020-06-24 MED ORDER — ESCITALOPRAM OXALATE 10 MG PO TABS: 10.0000 mg | ORAL_TABLET | Freq: Every day | ORAL | 1 refills | Status: DC

## 2020-06-24 NOTE — Progress Notes (Signed)
Established Patient Office Visit  Subjective:  Patient ID: Devin Marsh, male    DOB: [birth year]  Age: 34 y.o. MRN: [id number]  CC:  Chief Complaint  Patient presents with  . Follow-up    HPI Mr. Devin Marsh is a 34 year old presents for sweating a lot and states that body has been cramping up. Also, having pain in left hand. Concern about GI instructions - advised that is not my specialty any question direct them to GI. Discussed COVID vaccine and fears.  Past Medical History:  Diagnosis Date  . Abscess 04/13/2013    Past Surgical History:  Procedure Laterality Date  . APPENDECTOMY    . INCISION AND DRAINAGE PERIRECTAL ABSCESS N/A 04/13/2013   Procedure: IRRIGATION AND DEBRIDEMENT PERIRECTAL ABSCESS;  Surgeon: [name], MD;  Location: [facility];  Service: General;  Laterality: N/A;    Family History  Problem Relation Age of Onset  . Diabetes Mother   . Asthma Brother     Social History   Socioeconomic History  . Marital status: Single    Spouse name: Not on file  . Number of children: 2  . Years of education: Not on file  . Highest education level: Not on file  Occupational History  . Not on file  Tobacco Use  . Smoking status: Former Smoker    Types: Cigarettes    Quit date: 10/29/2017    Years since quitting: 2.6  . Smokeless tobacco: Never Used  Vaping Use  . Vaping Use: Some days  Substance and Sexual Activity  . Alcohol use: No  . Drug use: No  . Sexual activity: Yes  Other Topics Concern  . Not on file  Social History Narrative  . Not on file   Social Determinants of Health   Financial Resource Strain:   . Difficulty of Paying Living Expenses: Not on file  Food Insecurity:   . Worried About Charity fundraiser in the Last Year: Not on file  . Ran Out of Food in the Last Year: Not on file  Transportation Needs:   . Lack of Transportation (Medical): Not on file  . Lack of Transportation (Non-Medical): Not on file  Physical Activity:   .  Days of Exercise per Week: Not on file  . Minutes of Exercise per Session: Not on file  Stress:   . Feeling of Stress : Not on file  Social Connections:   . Frequency of Communication with Friends and Family: Not on file  . Frequency of Social Gatherings with Friends and Family: Not on file  . Attends Religious Services: Not on file  . Active Member of Clubs or Organizations: Not on file  . Attends Archivist Meetings: Not on file  . Marital Status: Not on file  Intimate Partner Violence:   . Fear of Current or Ex-Partner: Not on file  . Emotionally Abused: Not on file  . Physically Abused: Not on file  . Sexually Abused: Not on file    Outpatient Medications Prior to Visit  Medication Sig Dispense Refill  . atorvastatin (LIPITOR) 10 MG tablet Take 1 tablet (10 mg total) by mouth daily. (Patient not taking: Reported on 06/15/2020) 90 tablet 3  . hydrocortisone (ANUSOL-HC) 2.5 % rectal cream Place 1 application rectally 2 (two) times daily. (Patient not taking: Reported on 06/24/2020) 30 g 1   No facility-administered medications prior to visit.    No Known Allergies  ROS Review of Systems  Endocrine:  Sweating  Neurological: Positive for weakness and numbness.  All other systems reviewed and are negative.     Objective:    Physical Exam Vitals reviewed.  HENT:     Head: Normocephalic.     Nose: Nose normal.  Cardiovascular:     Rate and Rhythm: Normal rate and regular rhythm.     Pulses: Normal pulses.     Heart sounds: Normal heart sounds.  Pulmonary:     Effort: Pulmonary effort is normal.     Breath sounds: Normal breath sounds.  Musculoskeletal:        General: Normal range of motion.     Cervical back: Normal range of motion.  Skin:    General: Skin is warm and dry.  Neurological:     Mental Status: He is alert and oriented to person, place, and time.  Psychiatric:        Mood and Affect: Mood normal.        Behavior: Behavior normal.         Thought Content: Thought content normal.        Judgment: Judgment normal.     BP (!) 136/93   Pulse 85   Ht 5\' 9"  (1.753 m)   Wt 251 lb 6.4 oz (114 kg)   SpO2 99%   BMI 37.13 kg/m  Wt Readings from Last 3 Encounters:  06/24/20 251 lb 6.4 oz (114 kg)  06/15/20 254 lb 8 oz (115.4 kg)  05/30/20 (!) 254 lb 12.8 oz (115.6 kg)     Health Maintenance Due  Topic Date Due  . INFLUENZA VACCINE  05/29/2020    There are no preventive care reminders to display for this patient.  No results found for: TSH Lab Results  Component Value Date   WBC 10.3 05/30/2020   HGB 15.3 05/30/2020   HCT 45.8 05/30/2020   MCV 89 05/30/2020   PLT 284 05/30/2020   Lab Results  Component Value Date   NA 138 05/30/2020   K 4.2 05/30/2020   CO2 23 05/30/2020   GLUCOSE 116 (H) 05/30/2020   BUN 7 05/30/2020   CREATININE 1.01 05/30/2020   BILITOT <0.2 05/30/2020   ALKPHOS 103 05/30/2020   AST 23 05/30/2020   ALT 35 05/30/2020   PROT 7.1 05/30/2020   ALBUMIN 4.4 05/30/2020   CALCIUM 9.2 05/30/2020   ANIONGAP 8 05/12/2015   Lab Results  Component Value Date   CHOL 197 05/30/2020   Lab Results  Component Value Date   HDL 60 05/30/2020   Lab Results  Component Value Date   LDLCALC 112 (H) 05/30/2020   Lab Results  Component Value Date   TRIG 140 05/30/2020   Lab Results  Component Value Date   CHOLHDL 3.3 05/30/2020   Lab Results  Component Value Date   HGBA1C 5.8 (A) 05/30/2020      Assessment & Plan:  Wojciech was seen today for follow-up.  Diagnoses and all orders for this visit: [name] was seen today for follow-up.  Diagnoses and all orders for this visit:  Rectal bleeding Followed by GI refer all question   Tobacco abuse He is aware of iIncreased risk for lung cancer and other respiratory diseases recommend cessation.  He is dealing with a lot of stress which is causing increase smoking habit.  This will be reminded at each clinical visit.  Adjustment  disorder with mixed anxiety and depressed mood   Office Visit from 06/24/2020 in [facility]  PHQ-9 Total Score 1    After talking with patient score is not correct- start on low dose of SSRI.  Pain of left hand When using and applying pressure no swelling pain when palpitated able to make a fist.   Other orders -     escitalopram (LEXAPRO) 10 MG tablet; Take 1 tablet (10 mg total) by mouth daily.    Follow-up: No follow-ups on file.    Kerin Perna, NP

## 2020-06-24 NOTE — Patient Instructions (Signed)
Preventing Hypertension Hypertension, commonly called high blood pressure, is when the force of blood pumping through the arteries is too strong. Arteries are blood vessels that carry blood from the heart throughout the body. Over time, hypertension can damage the arteries and decrease blood flow to important parts of the body, including the brain, heart, and kidneys. Often, hypertension does not cause symptoms until blood pressure is very high. For this reason, it is important to have your blood pressure checked on a regular basis. Hypertension can often be prevented with diet and lifestyle changes. If you already have hypertension, you can control it with diet and lifestyle changes, as well as medicine. What nutrition changes can be made? Maintain a healthy diet. This includes:  Eating less salt (sodium). Ask your health care provider how much sodium is safe for you to have. The general recommendation is to consume less than 1 tsp (2,300 mg) of sodium a day. ? Do not add salt to your food. ? Choose low-sodium options when grocery shopping and eating out.  Limiting fats in your diet. You can do this by eating low-fat or fat-free dairy products and by eating less red meat.  Eating more fruits, vegetables, and whole grains. Make a goal to eat: ? 1-2 cups of fresh fruits and vegetables each day. ? 3-4 servings of whole grains each day.  Avoiding foods and beverages that have added sugars.  Eating fish that contain healthy fats (omega-3 fatty acids), such as mackerel or salmon. If you need help putting together a healthy eating plan, try the DASH diet. This diet is high in fruits, vegetables, and whole grains. It is low in sodium, red meat, and added sugars. DASH stands for Dietary Approaches to Stop Hypertension. What lifestyle changes can be made?   Lose weight if you are overweight. Losing just 3?5% of your body weight can help prevent or control hypertension. ? For example, if your present  weight is 200 lb (91 kg), a loss of 3-5% of your weight means losing 6-10 lb (2.7-4.5 kg). ? Ask your health care provider to help you with a diet and exercise plan to safely lose weight.  Get enough exercise. Do at least 150 minutes of moderate-intensity exercise each week. ? You could do this in short exercise sessions several times a day, or you could do longer exercise sessions a few times a week. For example, you could take a brisk 10-minute walk or bike ride, 3 times a day, for 5 days a week.  Find ways to reduce stress, such as exercising, meditating, listening to music, or taking a yoga class. If you need help reducing stress, ask your health care provider.  Do not smoke. This includes e-cigarettes. Chemicals in tobacco and nicotine products raise your blood pressure each time you smoke. If you need help quitting, ask your health care provider.  Avoid alcohol. If you drink alcohol, limit alcohol intake to no more than 1 drink a day for nonpregnant women and 2 drinks a day for men. One drink equals 12 oz of beer, 5 oz of wine, or 1 oz of hard liquor. Why are these changes important? Diet and lifestyle changes can help you prevent hypertension, and they may make you feel better overall and improve your quality of life. If you have hypertension, making these changes will help you control it and help prevent major complications, such as:  Hardening and narrowing of arteries that supply blood to: ? Your heart. This can cause a heart  attack. ? Your brain. This can cause a stroke. ? Your kidneys. This can cause kidney failure.  Stress on your heart muscle, which can cause heart failure. What can I do to lower my risk?  Work with your health care provider to make a hypertension prevention plan that works for you. Follow your plan and keep all follow-up visits as told by your health care provider.  Learn how to check your blood pressure at home. Make sure that you know your personal target  blood pressure, as told by your health care provider. How is this treated? In addition to diet and lifestyle changes, your health care provider may recommend medicines to help lower your blood pressure. You may need to try a few different medicines to find what works best for you. You also may need to take more than one medicine. Take over-the-counter and prescription medicines only as told by your health care provider. Where to find support Your health care provider can help you prevent hypertension and help you keep your blood pressure at a healthy level. Your local hospital or your community may also provide support services and prevention programs. The American Heart Association offers an online support network at: http://supportnetwork.heart.org/high-blood-pressure Where to find more information Learn more about hypertension from:  National Heart, Lung, and Blood Institute: www.nhlbi.nih.gov/health/health-topics/topics/hbp  Centers for Disease Control and Prevention: www.cdc.gov/bloodpressure  American Academy of Family Physicians: http://familydoctor.org/familydoctor/en/diseases-conditions/high-blood-pressure.printerview.all.html Learn more about the DASH diet from:  National Heart, Lung, and Blood Institute: www.nhlbi.nih.gov/health/health-topics/topics/dash Contact a health care provider if:  You think you are having a reaction to medicines you have taken.  You have recurrent headaches or feel dizzy.  You have swelling in your ankles.  You have trouble with your vision. Summary  Hypertension often does not cause any symptoms until blood pressure is very high. It is important to get your blood pressure checked regularly.  Diet and lifestyle changes are the most important steps in preventing hypertension.  By keeping your blood pressure in a healthy range, you can prevent complications like heart attack, heart failure, stroke, and kidney failure.  Work with your health care  provider to make a hypertension prevention plan that works for you. This information is not intended to replace advice given to you by your health care provider. Make sure you discuss any questions you have with your health care provider. Document Revised: 02/06/2019 Document Reviewed: 06/25/2016 Elsevier Patient Education  2020 Elsevier Inc.  

## 2020-06-24 NOTE — Progress Notes (Signed)
States that he has been sweating a lot and states that body has been cramping up.  Having pain in left hand.

## 2020-07-01 ENCOUNTER — Ambulatory Visit (INDEPENDENT_AMBULATORY_CARE_PROVIDER_SITE_OTHER): Payer: No Typology Code available for payment source

## 2020-07-01 ENCOUNTER — Ambulatory Visit: Payer: No Typology Code available for payment source

## 2020-07-01 ENCOUNTER — Other Ambulatory Visit: Payer: Self-pay | Admitting: Gastroenterology

## 2020-07-01 ENCOUNTER — Telehealth: Payer: Self-pay | Admitting: *Deleted

## 2020-07-01 DIAGNOSIS — Z1159 Encounter for screening for other viral diseases: Secondary | ICD-10-CM

## 2020-07-01 LAB — SARS CORONAVIRUS 2 (TAT 6-24 HRS): SARS Coronavirus 2: NEGATIVE

## 2020-07-01 NOTE — Telephone Encounter (Signed)
Pt called office, stating he "cannot afford prep."  His procedure is 07-06-20- I asked him if he is able to pick up a sample and he will come in today to pick up sample on the third floor

## 2020-07-06 ENCOUNTER — Other Ambulatory Visit: Payer: Self-pay

## 2020-07-06 ENCOUNTER — Telehealth: Payer: Self-pay

## 2020-07-06 ENCOUNTER — Encounter: Payer: Self-pay | Admitting: Gastroenterology

## 2020-07-06 ENCOUNTER — Ambulatory Visit (AMBULATORY_SURGERY_CENTER): Payer: No Typology Code available for payment source | Admitting: Gastroenterology

## 2020-07-06 VITALS — BP 140/84 | HR 89 | Temp 96.4°F | Resp 23 | Ht 69.0 in | Wt 254.0 lb

## 2020-07-06 DIAGNOSIS — K621 Rectal polyp: Secondary | ICD-10-CM

## 2020-07-06 DIAGNOSIS — K921 Melena: Secondary | ICD-10-CM | POA: Diagnosis not present

## 2020-07-06 DIAGNOSIS — D128 Benign neoplasm of rectum: Secondary | ICD-10-CM

## 2020-07-06 DIAGNOSIS — K640 First degree hemorrhoids: Secondary | ICD-10-CM | POA: Diagnosis not present

## 2020-07-06 MED ORDER — SODIUM CHLORIDE 0.9 % IV SOLN: 500.0000 mL | Freq: Once | INTRAVENOUS | Status: DC

## 2020-07-06 NOTE — Progress Notes (Signed)
Pt was too sleepy to speak with Dr. Fuller Plan when he arrived in the recovery room.  After pt was awake, Dr. Fuller Plan spoke with pt and said we could band his hemorrhoids in the office.  Pt said he was interested having this done.  Per Dr. Fuller Plan, he will have his office nurse call the pt and have this [facility] scheduled for the office.  Pt was also given a handout re: Hemorrhoidal Banding. No problems noted in the recovery room. maw

## 2020-07-06 NOTE — Progress Notes (Signed)
Report to PACU, RN, vss, BBS= Clear.  

## 2020-07-06 NOTE — Telephone Encounter (Signed)
Called patient to schedule first hemorrhoid banding per Dr. Fuller Plan. Offered first available appointment on 07/20/20. Patient states he is starting a new job and cannot commit to a date right now but will call back to schedule.

## 2020-07-06 NOTE — Op Note (Addendum)
Devin Marsh Patient Name: Devin Marsh Procedure Date: 07/06/2020 7:11 AM MRN: [id number] Endoscopist: Ladene Artist , MD Age: 34 Referring MD:  Date of Birth: [birth year] Gender: Male Account #: [id number] Procedure:                Colonoscopy Indications:              Hematochezia Medicines:                Monitored Anesthesia Care Procedure:                Pre-Anesthesia Assessment:                           - Prior to the procedure, a History and Physical                            was performed, and patient medications and                            allergies were reviewed. The patient's tolerance of                            previous anesthesia was also reviewed. The risks                            and benefits of the procedure and the sedation                            options and risks were discussed with the patient.                            All questions were answered, and informed consent                            was obtained. Prior Anticoagulants: The patient has                            taken no previous anticoagulant or antiplatelet                            agents. ASA Grade Assessment: II - A patient with                            mild systemic disease. After reviewing the risks                            and benefits, the patient was deemed in                            satisfactory condition to undergo the procedure.                           After obtaining informed consent, the colonoscope  was passed under direct vision. Throughout the                            procedure, the patient's blood pressure, pulse, and                            oxygen saturations were monitored continuously. The                            Colonoscope was introduced through the anus and                            advanced to the the cecum, identified by                            appendiceal orifice and ileocecal valve. The                             ileocecal valve, appendiceal orifice, and rectum                            were photographed. The quality of the bowel                            preparation was excellent. The colonoscopy was                            performed without difficulty. The patient tolerated                            the procedure well. Scope In: 8:07:55 AM Scope Out: 8:18:03 AM Scope Withdrawal Time: 0 hours 8 minutes 43 seconds  Total Procedure Duration: 0 hours 10 minutes 8 seconds  Findings:                 The perianal and digital rectal examinations were                            normal.                           A 7 mm polyp was found in the rectum. The polyp was                            sessile. The polyp was removed with a cold snare.                            Resection and retrieval were complete.                           Internal hemorrhoids were found during                            retroflexion. The hemorrhoids were medium-sized and  Grade I (internal hemorrhoids that do not prolapse).                           The exam was otherwise without abnormality on                            direct and retroflexion views. Complications:            No immediate complications. Estimated blood loss:                            None. Estimated Blood Loss:     Estimated blood loss: none. Impression:               - One 7 mm polyp in the rectum, removed with a cold                            snare. Resected and retrieved.                           - Internal hemorrhoids.                           - The examination was otherwise normal on direct                            and retroflexion views. Recommendation:           - Repeat colonoscopy date to be determined after                            pending pathology results are reviewed for                            surveillance based on pathology results.                           - Patient has a contact number  available for                            emergencies. The signs and symptoms of potential                            delayed complications were discussed with the                            patient. Return to normal activities tomorrow.                            Written discharge instructions were provided to the                            patient.                           - Resume previous diet.                           -  Continue present medications.                           - Await pathology results.                           - Schedule hemorrhoid banding. Ladene Artist, MD 07/06/2020 8:22:42 AM This report has been signed electronically.

## 2020-07-06 NOTE — Progress Notes (Signed)
Pt's states no medical or surgical changes since previsit or office visit.  CW - vitals 

## 2020-07-06 NOTE — Progress Notes (Signed)
Called to room to assist during endoscopic procedure.  Patient ID and intended procedure confirmed with present staff. Received instructions for my participation in the procedure from the performing physician.  

## 2020-07-06 NOTE — Telephone Encounter (Signed)
-----   Message from Ladene Artist, MD sent at 07/06/2020  9:01 AM EDT ----- Please call this patient later today or tomorrow to schedule hemorrhoid banding.  Colonoscopy in [place] today.

## 2020-07-06 NOTE — Patient Instructions (Addendum)
HANDOUTS PROVIDED ON: Polyps and Hemorrhoids  The polyps removed today have been sent for pathology.  The results can take 1-3 weeks to receive.  When your next colonoscopy should occur will be based on the pathology results.    You may resume your previous diet and medication schedule.  Thank you for allowing us to care for you today!!!    YOU HAD AN ENDOSCOPIC PROCEDURE TODAY AT THE [facility]:   Refer to the procedure report that was given to you for any specific questions about what was found during the examination.  If the procedure report does not answer your questions, please call your gastroenterologist to clarify.  If you requested that your care partner not be given the details of your procedure findings, then the procedure report has been included in a sealed envelope for you to review at your convenience later.  YOU SHOULD EXPECT: Some feelings of bloating in the abdomen. Passage of more gas than usual.  Walking can help get rid of the air that was put into your GI tract during the procedure and reduce the bloating. If you had a lower endoscopy (such as a colonoscopy or flexible sigmoidoscopy) you may notice spotting of blood in your stool or on the toilet paper. If you underwent a bowel prep for your procedure, you may not have a normal bowel movement for a few days.  Please Note:  You might notice some irritation and congestion in your nose or some drainage.  This is from the oxygen used during your procedure.  There is no need for concern and it should clear up in a day or so.  SYMPTOMS TO REPORT IMMEDIATELY:   Following lower endoscopy (colonoscopy or flexible sigmoidoscopy):  Excessive amounts of blood in the stool  Significant tenderness or worsening of abdominal pains  Swelling of the abdomen that is new, acute  Fever of 100F or higher   For urgent or emergent issues, a gastroenterologist can be reached at any hour by calling [phone]. Do not use  MyChart messaging for urgent concerns.    DIET:  We do recommend a small meal at first, but then you may proceed to your regular diet.  Drink plenty of fluids but you should avoid alcoholic beverages for 24 hours.  ACTIVITY:  You should plan to take it easy for the rest of today and you should NOT DRIVE or use heavy machinery until tomorrow (because of the sedation medicines used during the test).    FOLLOW UP: Our staff will call the number listed on your records 48-72 hours following your procedure to check on you and address any questions or concerns that you may have regarding the information given to you following your procedure. If we do not reach you, we will leave a message.  We will attempt to reach you two times.  During this call, we will ask if you have developed any symptoms of COVID 19. If you develop any symptoms (ie: fever, flu-like symptoms, shortness of breath, cough etc.) before then, please call [phone].  If you test positive for Covid 19 in the 2 weeks post procedure, please call and report this information to us.    If any biopsies were taken you will be contacted by phone or by letter within the next 1-3 weeks.  Please call us at [phone] if you have not heard about the biopsies in 3 weeks.    SIGNATURES/CONFIDENTIALITY: You and/or your care partner have signed paperwork which   signed paperwork which will be entered into your electronic medical record.  These signatures attest to the fact that that the information above on your After Visit Summary has been reviewed and is understood.  Full responsibility of the confidentiality of this discharge information lies with you and/or your care-partner.

## 2020-07-08 ENCOUNTER — Telehealth: Payer: Self-pay | Admitting: *Deleted

## 2020-07-08 ENCOUNTER — Telehealth: Payer: Self-pay

## 2020-07-08 NOTE — Telephone Encounter (Signed)
  Follow up Call-  Call back number 07/06/2020  Post procedure Call Back phone  # [phone]  Permission to leave phone message Yes  Some recent data might be hidden     Patient questions:  Do you have a fever, pain , or abdominal swelling? No. Pain Score  0 *  Have you tolerated food without any problems? Yes.    Have you been able to return to your normal activities? Yes.    Do you have any questions about your discharge instructions: Diet   No. Medications  No. Follow up visit  No.  Do you have questions or concerns about your Care? No.  Actions: * If pain score is 4 or above: No action needed, pain <4. 1. Have you developed a fever since your procedure? no  2.   Have you had an respiratory symptoms (SOB or cough) since your procedure? no  3.   Have you tested positive for COVID 19 since your procedure no  4.   Have you had any family members/close contacts diagnosed with the COVID 19 since your procedure?  no   If yes to any of these questions please route to Joylene John, RN and Joella Prince, RN

## 2020-07-08 NOTE — Telephone Encounter (Signed)
Patient contacted to schedule hemorrhoid banding.  Patient declines for now.  He understands to call back if he wishes to proceed in the future.

## 2020-07-08 NOTE — Telephone Encounter (Signed)
Left message on f/u call 

## 2020-07-18 ENCOUNTER — Encounter: Payer: Self-pay | Admitting: Gastroenterology

## 2020-08-05 ENCOUNTER — Ambulatory Visit (INDEPENDENT_AMBULATORY_CARE_PROVIDER_SITE_OTHER): Payer: No Typology Code available for payment source | Admitting: Primary Care

## 2021-06-01 ENCOUNTER — Telehealth (INDEPENDENT_AMBULATORY_CARE_PROVIDER_SITE_OTHER): Payer: No Typology Code available for payment source | Admitting: Primary Care

## 2021-06-21 ENCOUNTER — Other Ambulatory Visit: Payer: Self-pay

## 2021-06-21 ENCOUNTER — Ambulatory Visit (INDEPENDENT_AMBULATORY_CARE_PROVIDER_SITE_OTHER): Payer: BC Managed Care – PPO | Admitting: Primary Care

## 2021-06-21 ENCOUNTER — Encounter (INDEPENDENT_AMBULATORY_CARE_PROVIDER_SITE_OTHER): Payer: Self-pay | Admitting: Primary Care

## 2021-06-21 VITALS — BP 136/87 | HR 100 | Temp 97.5°F | Ht 69.0 in | Wt 260.6 lb

## 2021-06-21 DIAGNOSIS — Z6837 Body mass index (BMI) 37.0-37.9, adult: Secondary | ICD-10-CM

## 2021-06-21 DIAGNOSIS — I861 Scrotal varices: Secondary | ICD-10-CM | POA: Diagnosis not present

## 2021-06-21 DIAGNOSIS — R030 Elevated blood-pressure reading, without diagnosis of hypertension: Secondary | ICD-10-CM

## 2021-06-21 DIAGNOSIS — R7303 Prediabetes: Secondary | ICD-10-CM

## 2021-06-21 DIAGNOSIS — Z113 Encounter for screening for infections with a predominantly sexual mode of transmission: Secondary | ICD-10-CM

## 2021-06-21 LAB — POCT GLYCOSYLATED HEMOGLOBIN (HGB A1C): Hemoglobin A1C: 6.1 % — AB (ref 4.0–5.6)

## 2021-06-21 NOTE — Patient Instructions (Signed)
Varicocele  A varicocele is a swelling of veins in the scrotum. The scrotum is the sac that contains the testicles. Varicoceles can occur on either side of the scrotum, but they are more common on the left side. They occur most often in teenageboys and young men. In most cases, varicoceles are not a serious problem. They are usually small and painless and do not require treatment. Tests may be done to confirm the diagnosis. Treatment may be needed if: A varicocele is large, causes a lot of pain, or causes pain when exercising. Varicoceles are found on both sides of the scrotum. A varicocele causes a decrease in the size of the testicle in a growing adolescent. The person has fertility problems. What are the causes? This condition is the result of valves in the veins not working properly. Valves in the veins help to return blood from the scrotum and testicles to the heart. If these valves do not work well, blood flows backward and backs up into the veins, which causes the veins to swell. This is similar to what happenswhen varicose veins form in the leg. What are the signs or symptoms? Most varicoceles do not cause any symptoms. If symptoms do occur, they may include: Swelling on one side of the scrotum. The swelling may be more obvious when you are standing up. A lumpy feeling in the scrotum. A heavy feeling on one side of the scrotum. A dull ache in the scrotum, especially after exercise or prolonged standing or sitting. Slower growth or reduced size of the testicle on the side of the varicocele (in young males). Problems with fathering a child (fertility). This can occur if the testicle does not grow normally or if the condition causes problems with the sperm, such as a low sperm count or sperm that are not able to reach the egg (poor motility). How is this diagnosed? This condition is diagnosed based on: Your medical history. A physical exam. Your health care provider may inspect and feel  (palpate) the scrotal area to check for swollen or enlarged veins. An ultrasound. This may be done to confirm the diagnosis and to help rule out other causes of the swelling. How is this treated? Treatment is usually not needed for this condition. If you have any pain, your health care provider may prescribe or recommend medicine to help relieve it. You may need regular exams so your health care provider can monitor thevaricocele to ensure that it does not cause problems. When further treatment is needed, it may involve one of these options: Varicocelectomy. This is a surgery in which the swollen veins are tied off so that the flow of blood goes to other veins instead. Embolization. In this procedure, a small, thin tube (catheter) is used to place metal coils or other blocking items in the veins. This cuts off the blood flow to the swollen veins. Follow these instructions at home: Take over-the-counter and prescription medicines only as told by your health care provider. Wear supportive underwear. Use an athletic supporter when participating in sports activities. Keep all follow-up visits as told by your health care provider. This is important. Contact a health care provider if: Your pain is increasing. You have redness in the affected area. Your testicle becomes enlarged, swollen, or painful. You have swelling that does not decrease when you are lying down. One of your testicles is smaller than the other. Get help right away if: You develop swelling in your legs. You have difficulty breathing. Summary Varicocele is  a condition in which the veins in the scrotum are swollen or enlarged. In most cases, varicoceles do not require treatment. Treatment may be needed if you have pain, have problems with infertility, or have a smaller testicle associated with the varicocele. In some cases, the condition may be treated with a procedure to cut off the flow of blood to the swollen veins. This  information is not intended to replace advice given to you by your health care provider. Make sure you discuss any questions you have with your healthcare provider. Document Revised: 01/02/2019 Document Reviewed: 01/02/2019 Elsevier Patient Education  [employer].

## 2021-06-21 NOTE — Progress Notes (Signed)
Established Patient Office Visit  Subjective:  Patient ID: Devin Marsh, male    DOB: 07/23/1986  Age: 35 y.o. MRN: [id number]  CC:  Chief Complaint  Patient presents with   Referral    Hx of nodules on scrotum     HPI Mr. Devin Marsh presents for STD testing but after speaking with him voiced pain with sex and his scrotum bilateral pain and feels a strain.   Past Medical History:  Diagnosis Date   Abscess 04/13/2013    Past Surgical History:  Procedure Laterality Date   APPENDECTOMY     INCISION AND DRAINAGE PERIRECTAL ABSCESS N/A 04/13/2013   Procedure: IRRIGATION AND DEBRIDEMENT PERIRECTAL ABSCESS;  Surgeon: [name], MD;  Location: [facility];  Service: General;  Laterality: N/A;    Family History  Problem Relation Age of Onset   Diabetes Mother    Asthma Brother    Colon cancer Neg Hx    Esophageal cancer Neg Hx    Rectal cancer Neg Hx    Stomach cancer Neg Hx     Social History   Socioeconomic History   Marital status: Single    Spouse name: Not on file   Number of children: 2   Years of education: Not on file   Highest education level: Not on file  Occupational History   Not on file  Tobacco Use   Smoking status: Former    Types: Cigarettes    Quit date: 10/29/2017    Years since quitting: 3.6   Smokeless tobacco: Never  Vaping Use   Vaping Use: Some days  Substance and Sexual Activity   Alcohol use: No   Drug use: No   Sexual activity: Yes  Other Topics Concern   Not on file  Social History Narrative   Not on file   Social Determinants of Health   Financial Resource Strain: Not on file  Food Insecurity: Not on file  Transportation Needs: Not on file  Physical Activity: Not on file  Stress: Not on file  Social Connections: Not on file  Intimate Partner Violence: Not on file    Outpatient Medications Prior to Visit  Medication Sig Dispense Refill   atorvastatin (LIPITOR) 10 MG tablet Take 1 tablet (10 mg total) by mouth daily.  (Patient not taking: No sig reported) 90 tablet 3   celecoxib (CELEBREX) 100 MG capsule Take 1 capsule (100 mg total) by mouth 2 (two) times daily. (Patient not taking: Reported on 07/06/2020) 180 capsule 0   escitalopram (LEXAPRO) 10 MG tablet Take 1 tablet (10 mg total) by mouth daily. (Patient not taking: Reported on 07/06/2020) 90 tablet 1   hydrocortisone (ANUSOL-HC) 2.5 % rectal cream Place 1 application rectally 2 (two) times daily. (Patient not taking: Reported on 06/24/2020) 30 g 1   No facility-administered medications prior to visit.    No Known Allergies  ROS Review of Systems    Objective:    Physical Exam General: No apparent distress.Obese male  Eyes: Extraocular eye movements intact, pupils equal and round. Neck: Supple, trachea midline. Thyroid: No enlargement, mobile without fixation, no tenderness. Cardiovascular: Regular rhythm and rate, no murmur, normal radial pulses. Respiratory: Normal respiratory effort, clear to auscultation. Gastrointestinal: Normal pitch active bowel sounds, nontender abdomen without distention or appreciable hepatomegaly. Neurologic: +deep tendon reflexes , no tremor,  Musculoskeletal: Normal muscle tone, no tenderness on palpation of tibia, no excessive thoracic kyphosis. Skin: Appropriate warmth, no visible rash. Genital- scrotum same size and shape exam  epididymis raised , vas defer firm vas tender and painful Mental status: Alert, conversant, speech clear, thought logical, appropriate mood and affect, no hallucinations or delusions evident. Hematologic/lymphatic: No cervical adenopathy, no visible ecchymoses.  BP 136/87 (BP Location: Right Arm, Patient Position: Sitting, Cuff Size: Large)   Pulse 100   Temp (!) 97.5 F (36.4 C) (Temporal)   Ht '5\' 9"'$  (1.753 m)   Wt 260 lb 9.6 oz (118.2 kg)   SpO2 95%   BMI 38.48 kg/m  Wt Readings from Last 3 Encounters:  06/21/21 260 lb 9.6 oz (118.2 kg)  07/06/20 254 lb (115.2 kg)  06/24/20 251 lb  6.4 oz (114 kg)     Health Maintenance Due  Topic Date Due   COVID-19 Vaccine (1) Never done   INFLUENZA VACCINE  05/29/2021    There are no preventive care reminders to display for this patient.  No results found for: TSH Lab Results  Component Value Date   WBC 10.3 05/30/2020   HGB 15.3 05/30/2020   HCT 45.8 05/30/2020   MCV 89 05/30/2020   PLT 284 05/30/2020   Lab Results  Component Value Date   NA 138 05/30/2020   K 4.2 05/30/2020   CO2 23 05/30/2020   GLUCOSE 116 (H) 05/30/2020   BUN 7 05/30/2020   CREATININE 1.01 05/30/2020   BILITOT <0.2 05/30/2020   ALKPHOS 103 05/30/2020   AST 23 05/30/2020   ALT 35 05/30/2020   PROT 7.1 05/30/2020   ALBUMIN 4.4 05/30/2020   CALCIUM 9.2 05/30/2020   ANIONGAP 8 05/12/2015   Lab Results  Component Value Date   CHOL 197 05/30/2020   Lab Results  Component Value Date   HDL 60 05/30/2020   Lab Results  Component Value Date   LDLCALC 112 (H) 05/30/2020   Lab Results  Component Value Date   TRIG 140 05/30/2020   Lab Results  Component Value Date   CHOLHDL 3.3 05/30/2020   Lab Results  Component Value Date   HGBA1C 6.1 (A) 06/21/2021      Assessment & Plan:  Devin Marsh was seen today for referral.  Diagnoses and all orders for this visit:  Prediabetes -     HgB A1c 6.1 Monitoring foods that are high in carbohydrates are the following rice, potatoes, breads, sugars, and pastas.  Reduction in the intake (eating) will assist in lowering your blood sugars. Excercising  Screening for STD (sexually transmitted disease) Not  indicated . After discussing finding patient and girlfriend agreed   Class 2 severe obesity due to excess calories with serious comorbidity and body mass index (BMI) of 37.0 to 37.9 in adult [facility]) Obesity is 30-39 indicating an excess in caloric intake or underlining conditions. This may lead to other co-morbidities. Lifestyle modifications of diet and exercise may reduce obesity.   This will  also improve A1C- exercising 30 mins daily or 150 hrs weekly  Varicocele -     Ambulatory referral to Urology  Reviewed and discuss due to s/s needs urology f/u An enlargement of the veins within the scrotum. A varicocele may develop as a result of poorly functioning valves that are normally found in veins. In other cases, it may occur from compression of a vein by a nearby structure. Varicoceles often produce no symptoms but can cause low sperm production and decreased sperm quality, leading to infertility. Varicoceles that cause no symptoms typically require no treatment. Cases in which symptoms occur can be repaired surgically. Ambulatory referral to Urology  Blood pressure elevated without history of HTN Counseled on blood pressure goal of less than 130/80, low-sodium, DASH diet, medication compliance, 150 minutes of moderate intensity exercise per week.    Follow-up: Return in about 6 months (around 12/22/2021) for bp/a1c/fasting labs.    Kerin Perna, NP

## 2021-07-05 ENCOUNTER — Telehealth (INDEPENDENT_AMBULATORY_CARE_PROVIDER_SITE_OTHER): Payer: Self-pay | Admitting: *Deleted

## 2021-07-05 NOTE — Telephone Encounter (Signed)
Sent to PCP ?

## 2021-07-05 NOTE — Telephone Encounter (Signed)
Dr. Pamelia Hoit left message unable to take orders from outside provider. [phone]

## 2021-07-05 NOTE — Telephone Encounter (Signed)
Dr. Zachery Dakins with [facility] saw patient yesterday with sudden vision loss. He has a swollen optic nerve. Physician is calling as the patient needs blood work and MRI. He will fax blood tests orders to our office as well as an order for MRI to [place]. Stated these are not urgent but should be done within

## 2021-08-09 ENCOUNTER — Inpatient Hospital Stay (INDEPENDENT_AMBULATORY_CARE_PROVIDER_SITE_OTHER): Payer: BC Managed Care – PPO | Admitting: Primary Care

## 2021-08-17 ENCOUNTER — Other Ambulatory Visit: Payer: Self-pay

## 2021-08-17 ENCOUNTER — Other Ambulatory Visit (HOSPITAL_COMMUNITY)
Admission: RE | Admit: 2021-08-17 | Discharge: 2021-08-17 | Disposition: A | Discharge status: Home / Self Care | Payer: BC Managed Care – PPO | Source: Ambulatory Visit | Attending: Primary Care | Admitting: Primary Care

## 2021-08-17 ENCOUNTER — Ambulatory Visit (INDEPENDENT_AMBULATORY_CARE_PROVIDER_SITE_OTHER): Payer: BC Managed Care – PPO | Admitting: Primary Care

## 2021-08-17 VITALS — BP 133/90 | HR 71 | Temp 97.3°F | Ht 69.0 in | Wt 261.0 lb

## 2021-08-17 DIAGNOSIS — Z113 Encounter for screening for infections with a predominantly sexual mode of transmission: Secondary | ICD-10-CM | POA: Diagnosis present

## 2021-08-17 NOTE — Progress Notes (Signed)
  [facility]    HPI Mr. Devin Marsh is a 35 year old obese male presents for STD screening.  He has requested this to be done before he starts medication for multiple sclerosis and optic neuritis.  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  He does have headaches frequently but he is followed by neurology. Past Medical History:  Diagnosis Date   Abscess 04/13/2013     No Known Allergies    Current Outpatient Medications on File Prior to Visit  Medication Sig Dispense Refill   acetaminophen (TYLENOL) 500 MG tablet Take 500 mg by mouth as needed.     Cholecalciferol 125 MCG (5000 UT) TABS Take by mouth.     nortriptyline (PAMELOR) 25 MG capsule Take 25 mg by mouth at bedtime.     timolol (TIMOPTIC) 0.5 % ophthalmic solution 1 drop 2 (two) times daily.     No current facility-administered medications on file prior to visit.    ROS: all negative except above.   Physical Exam: Filed Weights   08/17/21 1558  Weight: 261 lb (118.4 kg)   BP 133/90 (BP Location: Right Arm, Patient Position: Sitting, Cuff Size: Large)   Pulse 71   Temp (!) 97.3 F (36.3 C) (Temporal)   Ht 5\' 9"  (1.753 m)   Wt 261 lb (118.4 kg)   SpO2 94%   BMI 38.54 kg/m  General Appearance: Well nourished, in no apparent distress. Eyes: PERRLA, EOMs, conjunctiva no swelling or erythema Sinuses: No Frontal/maxillary tenderness ENT/Mouth: Ext aud canals clear, TMs without erythema, bulging. No erythema, swelling, or exudate on post pharynx.  Tonsils not swollen or erythematous. Hearing normal.  Neck: Supple, thyroid normal.  Respiratory: Respiratory effort normal, BS equal bilaterally without rales, rhonchi, wheezing or stridor.  Cardio: RRR  with no MRGs. Brisk peripheral pulses without edema.  Abdomen: Soft, + BS.  Non tender, no guarding, rebound, hernias, masses. Lymphatics: Non tender without lymphadenopathy.  Musculoskeletal: Full ROM, 5/5 strength, normal gait.  Skin: Warm, dry without rashes, lesions, ecchymosis.  Neuro: Cranial nerves intact. Normal muscle tone, no cerebellar symptoms. Sensation intact.  Psych: Awake and oriented X 3, normal affect, Insight and Judgment appropriate.    Devin Marsh was seen today for std testing.  Diagnoses and all orders for this visit:  Screening for STD (sexually transmitted disease) -     STD Screen (6) -     HIV antibody (with reflex) -     [facility]    Kerin Perna, NP 4:20 PM

## 2021-08-18 LAB — HIV ANTIBODY (ROUTINE TESTING W REFLEX): HIV Screen 4th Generation wRfx: NONREACTIVE

## 2021-08-18 LAB — RPR: RPR Ser Ql: NONREACTIVE

## 2021-08-22 LAB — CERVICOVAGINAL ANCILLARY ONLY
Candida Urine: NEGATIVE
Chlamydia: NEGATIVE
Comment: NEGATIVE
Comment: NEGATIVE
Comment: NORMAL
Neisseria Gonorrhea: NEGATIVE
Trichomonas: NEGATIVE

## 2021-08-24 ENCOUNTER — Inpatient Hospital Stay (INDEPENDENT_AMBULATORY_CARE_PROVIDER_SITE_OTHER): Payer: BC Managed Care – PPO | Admitting: Primary Care

## 2021-08-24 ENCOUNTER — Telehealth: Payer: Self-pay | Admitting: *Deleted

## 2021-08-24 NOTE — Telephone Encounter (Signed)
Copied from [place] [id number]. Topic: General - Inquiry >> Aug 24, 2021  3:14 PM Oneta Rack wrote: Patient inquiring about most recent lab results and would like PCP to post results in my chart as soon as possible. Patient states time is sensitive due to upcoming treatment

## 2021-08-25 NOTE — Telephone Encounter (Signed)
Pt returned your call.  He said whatever it is, ok to leave on his VM.

## 2021-08-25 NOTE — Telephone Encounter (Signed)
Patient is aware of negative STD panel.

## 2021-08-25 NOTE — Telephone Encounter (Signed)
Left message asking patient to return call to [facility]  [phone].

## 2021-12-14 ENCOUNTER — Telehealth (INDEPENDENT_AMBULATORY_CARE_PROVIDER_SITE_OTHER): Payer: Self-pay | Admitting: Primary Care

## 2021-12-14 ENCOUNTER — Telehealth: Payer: Self-pay | Admitting: Primary Care

## 2021-12-14 NOTE — Telephone Encounter (Signed)
Pt requesting a follow-up with Clifton James. Will route encounter to him.

## 2021-12-14 NOTE — Telephone Encounter (Signed)
Pt is a pt of [facility] - pt has no insurance. Pt is wanting information from [place] on [employer]. And to set up an application. Please advise CB- [phone]

## 2021-12-14 NOTE — Telephone Encounter (Signed)
I return Pt call, no answer to the call or VM

## 2021-12-18 ENCOUNTER — Ambulatory Visit (INDEPENDENT_AMBULATORY_CARE_PROVIDER_SITE_OTHER): Payer: BC Managed Care – PPO | Admitting: Primary Care

## 2022-01-15 ENCOUNTER — Ambulatory Visit (INDEPENDENT_AMBULATORY_CARE_PROVIDER_SITE_OTHER): Payer: BC Managed Care – PPO | Admitting: Primary Care

## 2022-01-25 ENCOUNTER — Ambulatory Visit (INDEPENDENT_AMBULATORY_CARE_PROVIDER_SITE_OTHER): Payer: BC Managed Care – PPO | Admitting: Primary Care

## 2022-03-01 ENCOUNTER — Ambulatory Visit (INDEPENDENT_AMBULATORY_CARE_PROVIDER_SITE_OTHER): Payer: BC Managed Care – PPO | Admitting: Primary Care

## 2022-03-29 ENCOUNTER — Ambulatory Visit (INDEPENDENT_AMBULATORY_CARE_PROVIDER_SITE_OTHER): Payer: BC Managed Care – PPO | Admitting: Primary Care

## 2022-10-24 ENCOUNTER — Encounter (INDEPENDENT_AMBULATORY_CARE_PROVIDER_SITE_OTHER): Payer: Self-pay | Admitting: Primary Care

## 2022-10-24 ENCOUNTER — Ambulatory Visit (INDEPENDENT_AMBULATORY_CARE_PROVIDER_SITE_OTHER): Payer: Medicaid Other | Admitting: Primary Care

## 2022-10-24 ENCOUNTER — Other Ambulatory Visit (HOSPITAL_COMMUNITY)
Admission: RE | Admit: 2022-10-24 | Discharge: 2022-10-24 | Disposition: A | Discharge status: Home / Self Care | Payer: Medicaid Other | Source: Ambulatory Visit | Attending: Primary Care | Admitting: Primary Care

## 2022-10-24 ENCOUNTER — Other Ambulatory Visit (INDEPENDENT_AMBULATORY_CARE_PROVIDER_SITE_OTHER): Payer: Self-pay | Admitting: Primary Care

## 2022-10-24 VITALS — BP 128/82 | HR 95 | Temp 98.0°F | Resp 16 | Ht 70.0 in

## 2022-10-24 DIAGNOSIS — Z114 Encounter for screening for human immunodeficiency virus [HIV]: Secondary | ICD-10-CM

## 2022-10-24 DIAGNOSIS — Z113 Encounter for screening for infections with a predominantly sexual mode of transmission: Secondary | ICD-10-CM | POA: Diagnosis present

## 2022-10-24 DIAGNOSIS — N521 Erectile dysfunction due to diseases classified elsewhere: Secondary | ICD-10-CM | POA: Diagnosis not present

## 2022-10-24 DIAGNOSIS — R21 Rash and other nonspecific skin eruption: Secondary | ICD-10-CM

## 2022-10-24 DIAGNOSIS — R7303 Prediabetes: Secondary | ICD-10-CM

## 2022-10-24 DIAGNOSIS — B379 Candidiasis, unspecified: Secondary | ICD-10-CM

## 2022-10-24 MED ORDER — NYSTATIN 100000 UNIT/GM EX CREA: 1.0000 | TOPICAL_CREAM | Freq: Two times a day (BID) | CUTANEOUS | 0 refills | Status: DC

## 2022-10-24 NOTE — Progress Notes (Signed)
Being seen by Neurology for MS Medication refill on Cialis  5 mg Would like lab test to screen for STD's

## 2022-10-24 NOTE — Progress Notes (Signed)
Devin Marsh, is a 36 y.o. male  ZDG:[id number]  [id number]:[id number]  DOB - 1986-03-08  Chief Complaint  Patient presents with   Medication Refill   lab test       Subjective:   Devin Marsh is a 36 y.o. male here today requesting STD screening and medication refills. Patient has No headache, No chest pain, No abdominal pain - No Nausea, No new weakness tingling or numbness, No Cough - shortness of breath  No problems updated.  No Known Allergies  Past Medical History:  Diagnosis Date   Abscess 04/13/2013    Current Outpatient Medications on File Prior to Visit  Medication Sig Dispense Refill   acetaminophen (TYLENOL) 500 MG tablet Take 500 mg by mouth as needed.     Cholecalciferol 125 MCG (5000 UT) TABS Take by mouth.     diazepam (VALIUM) 10 MG tablet Take 10 mg by mouth every 6 (six) hours as needed for anxiety.     gabapentin (NEURONTIN) 100 MG capsule Take 100 mg by mouth 3 (three) times daily.     meloxicam (MOBIC) 15 MG tablet Take 15 mg by mouth daily.     nortriptyline (PAMELOR) 25 MG capsule Take 50 mg by mouth at bedtime.     Ofatumumab (KESIMPTA) 20 MG/0.4ML SOAJ Inject 20 mg into the skin every 30 (thirty) days.     tadalafil (CIALIS) 5 MG tablet Take 5 mg by mouth daily as needed for erectile dysfunction.     timolol (TIMOPTIC) 0.5 % ophthalmic solution 1 drop 2 (two) times daily.     No current facility-administered medications on file prior to visit.    Objective:   Vitals:   10/24/22 1439 10/24/22 1448  BP: (Abnormal) 138/91 128/82  Pulse: 95   Resp: 16   Temp: 98 F (36.7 C)   SpO2: 95%   Height: '5\' 10"'$  (1.778 m) '5\' 10"'$  (1.778 m)    Exam General appearance : Awake, alert, not in any distress. Speech Clear. Not toxic looking HEENT: Atraumatic and Normocephalic, pupils equally reactive to light and accomodation Neck: Supple, no JVD. No cervical lymphadenopathy.  Chest: Good air entry bilaterally, no added  sounds  CVS: S1 S2 regular, no murmurs.  Abdomen: Bowel sounds present, Non tender and not distended with no gaurding, rigidity or rebound. Extremities: B/L Lower Ext shows no edema, both legs are warm to touch Neurology: Awake alert, and oriented X 3, CN II-XII intact, Non focal Skin: Groin area/intergluteal cleft fungal   Data Review Lab Results  Component Value Date   HGBA1C 6.1 (A) 06/21/2021   HGBA1C 5.8 (A) 05/30/2020    Assessment & Plan  Devin Marsh was seen today for medication refill and lab test.  Diagnoses and all orders for this visit:  Screening for STD (sexually transmitted disease) -     HIV Antibody (routine testing w rflx) -     Urine cytology ancillary only   Encounter for screening for HIV -     HIV Antibody (routine testing w rflx)  Prediabetes -     Hemoglobin A1c 6.1 - educated on lifestyle modifications, including but not limited to diet choices and adding exercise to daily routine.    Candida infection 2/2 Rash and nonspecific skin eruption Groin area/intergluteal cleft -     nystatin cream (MYCOSTATIN); Apply 1 Application topically 2 (two) times daily.   Patient have been counseled extensively about nutrition and exercise. Other issues discussed during this visit include: low  cholesterol diet, weight control and daily exercise, foot care, annual eye examinations at Ophthalmology, importance of adherence with medications and regular follow-up. We also discussed long term complications of uncontrolled diabetes and hypertension.   Return in about 6 months (around 04/25/2023).  The patient was given clear instructions to go to ER or return to medical center if symptoms don't improve, worsen or new problems develop. The patient verbalized understanding. The patient was told to call to get lab results if they haven't heard anything in the next week.   This note has been created with Surveyor, quantity. Any  transcriptional errors are unintentional.   Kerin Perna, NP 11/01/2022, 5:05 PM

## 2022-10-26 LAB — URINE CYTOLOGY ANCILLARY ONLY
Candida Urine: NEGATIVE
Chlamydia: NEGATIVE
Comment: NEGATIVE
Comment: NEGATIVE
Comment: NORMAL
Neisseria Gonorrhea: NEGATIVE
Trichomonas: NEGATIVE

## 2022-10-27 LAB — SPECIMEN STATUS REPORT

## 2022-10-27 LAB — HIV ANTIBODY (ROUTINE TESTING W REFLEX): HIV Screen 4th Generation wRfx: NONREACTIVE

## 2022-11-15 ENCOUNTER — Other Ambulatory Visit (INDEPENDENT_AMBULATORY_CARE_PROVIDER_SITE_OTHER): Payer: Self-pay | Admitting: Primary Care

## 2022-11-15 NOTE — Telephone Encounter (Signed)
Medication Refill - Medication: tadalafil (CIALIS) 5 MG tablet and an eczema cream (pt was unsure of the medication name)  Pt was upset and stated this was supposed to be sent in at his last appointment on 12/27. Pt is requesting that this be sent to the pharmacy today.   Please advise.   Has the patient contacted their pharmacy? Yes.    (Agent: If yes, when and what did the pharmacy advise?)  Preferred Pharmacy (with phone number or street name):  [facility], [place]  [place]  [address]  Phone: [phone] Fax: [phone]  Hours: Not open 24 hours   Has the patient been seen for an appointment in the last year OR does the patient have an upcoming appointment? Yes.    Agent: Please be advised that RX refills may take up to 3 business days. We ask that you follow-up with your pharmacy.

## 2022-11-15 NOTE — Telephone Encounter (Signed)
Requested medication (s) are due for refill today: historical medication  Requested medication (s) are on the active medication list: yes  Last refill:  na  Future visit scheduled: yes in 5 months  Notes to clinic:  historical medication. Reports told at last appt medication would be prescribed. Do you want to order Rx?     Requested Prescriptions  Pending Prescriptions Disp Refills   tadalafil (CIALIS) 5 MG tablet 10 tablet     Sig: Take 1 tablet (5 mg total) by mouth daily as needed for erectile dysfunction.     Urology: Erectile Dysfunction Agents Failed - 11/15/2022  1:24 PM      Failed - AST in normal range and within 360 days    AST  Date Value Ref Range Status  05/30/2020 23 0 - 40 IU/L Final         Failed - ALT in normal range and within 360 days    ALT  Date Value Ref Range Status  05/30/2020 35 0 - 44 IU/L Final         Passed - Last BP in normal range    BP Readings from Last 1 Encounters:  10/24/22 128/82         Passed - Valid encounter within last 12 months    Recent Outpatient Visits           3 weeks ago Screening for STD (sexually transmitted disease)   [place], Harlan, NP   1 year ago Screening for STD (sexually transmitted disease)   [place], Michelle P, NP   1 year ago Prediabetes   [place], Michelle P, NP   2 years ago Rectal bleeding   [place], Michelle P, NP   2 years ago Encounter to establish care   [place] Kerin Perna, NP       Future Appointments             In 5 months Oletta Lamas, Milford Cage, NP [place]

## 2022-11-16 NOTE — Telephone Encounter (Signed)
Will forward to provider  

## 2023-04-25 ENCOUNTER — Encounter (INDEPENDENT_AMBULATORY_CARE_PROVIDER_SITE_OTHER): Payer: Self-pay | Admitting: Primary Care

## 2023-04-25 ENCOUNTER — Ambulatory Visit (INDEPENDENT_AMBULATORY_CARE_PROVIDER_SITE_OTHER): Payer: BC Managed Care – PPO | Admitting: Primary Care

## 2023-04-25 VITALS — BP 134/86 | HR 98 | Ht 69.0 in | Wt 273.0 lb

## 2023-04-25 DIAGNOSIS — Z1211 Encounter for screening for malignant neoplasm of colon: Secondary | ICD-10-CM

## 2023-04-25 DIAGNOSIS — M25512 Pain in left shoulder: Secondary | ICD-10-CM

## 2023-04-25 DIAGNOSIS — Z86010 Personal history of colonic polyps: Secondary | ICD-10-CM | POA: Diagnosis not present

## 2023-04-25 DIAGNOSIS — K921 Melena: Secondary | ICD-10-CM

## 2023-04-25 DIAGNOSIS — R7303 Prediabetes: Secondary | ICD-10-CM | POA: Diagnosis not present

## 2023-04-25 LAB — POCT GLYCOSYLATED HEMOGLOBIN (HGB A1C): HbA1c, POC (controlled diabetic range): 6.4 % (ref 0.0–7.0)

## 2023-04-25 NOTE — Patient Instructions (Signed)

## 2023-04-25 NOTE — Progress Notes (Signed)
Left shoulder pain Upcoming surgery Discuss starting ozempic Wants allergy testing

## 2023-04-28 NOTE — Progress Notes (Signed)
[facility]  Devin Marsh, is a 37 y.o. male  [id number]:[id number]  [id number]  DOB - 02-13-1986  Chief Complaint  Patient presents with   Shoulder Pain       Subjective:   Devin Marsh is a 37 y.o. male here today for acute visit. He c/o left shoulder pain wanting to discuss upcoming surgery , discuss starting ozempic wanting to be allergy testing Patient has No headache, No chest pain, No abdominal pain - No Nausea, No new weakness tingling or numbness, No Cough - shortness of breath  No problems updated.  No Known Allergies  Past Medical History:  Diagnosis Date   Abscess 04/13/2013    Current Outpatient Medications on File Prior to Visit  Medication Sig Dispense Refill   Cholecalciferol 125 MCG (5000 UT) TABS Take by mouth.     gabapentin (NEURONTIN) 100 MG capsule Take 100 mg by mouth 3 (three) times daily.     nortriptyline (PAMELOR) 25 MG capsule Take 50 mg by mouth at bedtime.     Ofatumumab (KESIMPTA) 20 MG/0.4ML SOAJ Inject 20 mg into the skin every 30 (thirty) days.     tadalafil (CIALIS) 5 MG tablet Take 5 mg by mouth daily as needed for erectile dysfunction.     acetaminophen (TYLENOL) 500 MG tablet Take 500 mg by mouth as needed. (Patient not taking: Reported on 04/25/2023)     diazepam (VALIUM) 10 MG tablet Take 10 mg by mouth every 6 (six) hours as needed for anxiety. (Patient not taking: Reported on 04/25/2023)     meloxicam (MOBIC) 15 MG tablet Take 15 mg by mouth daily. (Patient not taking: Reported on 04/25/2023)     nystatin cream (MYCOSTATIN) Apply 1 Application topically 2 (two) times daily. (Patient not taking: Reported on 04/25/2023) 30 g 0   timolol (TIMOPTIC) 0.5 % ophthalmic solution 1 drop 2 (two) times daily. (Patient not taking: Reported on 04/25/2023)     No current facility-administered medications on file prior to visit.    Objective:   Vitals:   04/25/23 1041  BP: 134/86  Pulse: 98  SpO2: 97%  Weight: 273 lb (123.8  kg)  Height: 5\' 9"  (1.753 m)    Comprehensive ROS Pertinent positive and negative noted in HPI   Exam General appearance : Awake, alert, not in any distress. Speech Clear. Not toxic looking HEENT: Atraumatic and Normocephalic, pupils equally reactive to light and accomodation Neck: Supple, no JVD. No cervical lymphadenopathy.  Chest: Good air entry bilaterally, no added sounds  CVS: S1 S2 regular, no murmurs.  Abdomen: Bowel sounds present, Non tender and not distended with no gaurding, rigidity or rebound. Extremities: B/L Lower Ext shows no edema, both legs are warm to touch Neurology: Awake alert, and oriented X 3, CN II-XII intact, Non focal Skin: No Rash  Data Review Lab Results  Component Value Date   HGBA1C 6.4 04/25/2023   HGBA1C 6.1 (A) 06/21/2021   HGBA1C 5.8 (A) 05/30/2020    Assessment & Plan  Kainin was seen today for shoulder pain.  Diagnoses and all orders for this visit:  Colon cancer screening 2/2 History of colonic polyps -     Ambulatory referral to Gastroenterology  Hematochezia -     Ambulatory referral to Gastroenterology  Acute pain of left shoulder -     Ambulatory referral to Orthopedic Surgery  Prediabetes You are prediabetic .  Prediabetes is 5.7-6.4 monitor carbohydrates -rice, potatoes, tortillas, breads, pasta, sweets, sodas.  Increase exercising to help  maintain appropriate weight.  -     HgB A1c 6.4  Patient have been counseled extensively about nutrition and exercise. Other issues discussed during this visit include: low cholesterol diet, weight control and daily exercise, foot care, annual eye examinations at Ophthalmology, importance of adherence with medications and regular follow-up. We also discussed long term complications of uncontrolled diabetes and hypertension.   Return in about 3 months (around 07/26/2023) for HTN.  The patient was given clear instructions to go to ER or return to medical center if symptoms don't improve,  worsen or new problems develop. The patient verbalized understanding. The patient was told to call to get lab results if they haven't heard anything in the next week.   This note has been created with Education officer, environmental. Any transcriptional errors are unintentional.   Grayce Sessions, NP 04/28/2023, 10:10 PM

## 2023-05-13 ENCOUNTER — Other Ambulatory Visit (INDEPENDENT_AMBULATORY_CARE_PROVIDER_SITE_OTHER): Payer: Medicaid Other

## 2023-05-13 ENCOUNTER — Ambulatory Visit (INDEPENDENT_AMBULATORY_CARE_PROVIDER_SITE_OTHER): Payer: Medicaid Other | Admitting: Sports Medicine

## 2023-05-13 ENCOUNTER — Encounter: Payer: Self-pay | Admitting: Sports Medicine

## 2023-05-13 ENCOUNTER — Other Ambulatory Visit: Payer: Self-pay

## 2023-05-13 DIAGNOSIS — M25512 Pain in left shoulder: Secondary | ICD-10-CM

## 2023-05-13 DIAGNOSIS — R293 Abnormal posture: Secondary | ICD-10-CM | POA: Diagnosis not present

## 2023-05-13 DIAGNOSIS — M62838 Other muscle spasm: Secondary | ICD-10-CM | POA: Diagnosis not present

## 2023-05-13 DIAGNOSIS — G8929 Other chronic pain: Secondary | ICD-10-CM | POA: Diagnosis not present

## 2023-05-13 MED ORDER — METHYLPREDNISOLONE ACETATE 40 MG/ML IJ SUSP
40.0000 mg | INTRAMUSCULAR | Status: AC | PRN
Start: 2023-05-13 — End: 2023-05-13
  Administered 2023-05-13: 40 mg via INTRAMUSCULAR

## 2023-05-13 MED ORDER — LIDOCAINE HCL 1 % IJ SOLN
1.0000 mL | INTRAMUSCULAR | Status: AC | PRN
Start: 2023-05-13 — End: 2023-05-13
  Administered 2023-05-13: 1 mL

## 2023-05-13 MED ORDER — BUPIVACAINE HCL 0.25 % IJ SOLN
1.0000 mL | INTRAMUSCULAR | Status: AC | PRN
Start: 2023-05-13 — End: 2023-05-13
  Administered 2023-05-13: 1 mL

## 2023-05-13 NOTE — Progress Notes (Signed)
Left shoulder pain for 2m-1 year No injury Has a history of MS Always has numbness down both arms Tylenol for pain when severe Is is PT for his MS/ balance issues  Patient was instructed in 10 minutes of therapeutic exercises for left shoulder to improve strength, ROM and function according to my instructions and plan of care by a Certified Athletic Trainer during the office visit. A customized handout was provided and demonstration of proper technique shown and discussed. Patient did perform exercises and demonstrate understanding through teachback.  All questions discussed and answered.

## 2023-05-13 NOTE — Progress Notes (Signed)
Devin Marsh - 37 y.o. male MRN [id number]  Date of birth: Sep 05, 1986  Office Visit Note: Visit Date: 05/13/2023 PCP: Grayce Sessions, NP Referred by: Grayce Sessions, NP  Subjective: Chief Complaint  Patient presents with   Left Shoulder - Pain   HPI: Devin Marsh is a pleasant 37 y.o. male who presents today for chronic left shoulder pain.  Patient has notable past medical history of multiple sclerosis. He is in PT for his MS/balance issues Chart review performed today noted that he is managed on Kesimpta 20 mg every month Serum creatinine was 1.02 on labs from 12/04/22. Last A1c was 6.4 on 04/25/2023, placing him in the prediabetic category  Devin Marsh has had left-sided trapezius and shoulder pain for the last year or more.  Denies any specific injury.  He does have some numbness in bilateral arms but has had this for years and thinks it is from his multiple sclerosis.  He has some tightness in the muscles as well as pain at the top part of the left shoulder.  Has not had any sort of therapy to date. Takes Tylenol as needed.  Has taken Meloxicam 15mg  in the past as well, but wanted to be cautious with kidney and liver protection.  Pertinent ROS were reviewed with the patient and found to be negative unless otherwise specified above in HPI.   Assessment & Plan: Visit Diagnoses:  1. Chronic left shoulder pain   2. Trapezius muscle spasm   3. Arthralgia of left acromioclavicular joint   4. Abnormal posture   5. Trigger point of left shoulder region    Plan: Discussed with Devin Marsh possible etiology of his chronic left shoulder pain.  He does have significant hypertonicity of the left trapezius muscles with associated trigger points.  Through shared decision-making, did proceed with trigger point injection into the left trapezius muscle belly, patient tolerated well.  He does have bilateral shoulder protraction and I think part of his postural is putting more pressure on  the trapezius as well as the shoulder/scapular region.  We discussed formal physical therapy versus home therapy, he would like to try exercises at home.  We did print out a customized handout for scapular stabilization and postural changes that my athletic trainer Devin Marsh did review with him in the room today.  I would like him to hold on these for the next 24 hours and then may begin perform these once daily to improve posture and shoulder mobility.  He can discontinue the meloxicam at this point if it was not super helpful to preserve his kidney and liver function.  I would like to see him back in about 2 weeks to see how he is doing with the home therapy as well as a trigger point injection into the trapezius muscle.  If in 2 weeks he is still having pain over the [facility] joint, we could always consider an AC joint injection at that time. Will re-evaluate in 2 weeks.  Follow-up: Return in about 2 weeks (around 05/27/2023) for for L-shoulder/AC joint pain (30-mins for poss US-injection).   Meds & Orders: No orders of the defined types were placed in this encounter.   Orders Placed This Encounter  Procedures   Trigger Point Inj   XR Cervical Spine 2 or 3 views   XR Shoulder Left     Procedures: Trigger Point Inj  Date/Time: 05/13/2023 6:18 PM  Performed by: Madelyn Brunner, DO Authorized by: Madelyn Brunner, DO   Site marked: the procedure  site was marked   Timeout: prior to procedure the correct patient, procedure, and site was verified   Indications:  Muscle spasm and pain Total # of Trigger Points:  2 Location: shoulder   Needle Size:  27 G Approach:  Dorsal Medications #1:  1 mL lidocaine 1 %; 1 mL bupivacaine 0.25 %; 40 mg methylPREDNISolone acetate 40 MG/ML Medications #2:  1 mL lidocaine 1 %; 1 mL bupivacaine 0.25 % Patient tolerance:  Patient tolerated the procedure well with no immediate complications Comments: Procedure: Trigger point injections (2), left trapezius muscle After  discussion on R/B/I and informed verbal consent was obtained, a timeout was conducted. The patient was placed in a seated position on the examination table and the area of maximal tenderness was identified over the left trapezius shoulder area.  This area was cleansed with Betadine and multiple alcohol swabs. Ethyl chloride was used for local anesthesia. Using a 27-gauge 1.0 inch needle the 2 trigger point(s) was subsequently injected with mixture as seen above into each trigger point. A band-aid was applied following. Patient tolerated procedure well, there were no post-injection complications. Post-procedure instructions were given.         Clinical History: No specialty comments available.  He reports that he quit smoking about 5 years ago. His smoking use included cigarettes. He has never used smokeless tobacco.  Recent Labs    04/25/23 1130  HGBA1C 6.4    Objective:   Vital Signs: There were no vitals taken for this visit.  Physical Exam  Gen: Well-appearing, in no acute distress; non-toxic CV: Well-perfused. Warm.  Resp: Breathing unlabored on room air; no wheezing. Psych: Fluid speech in conversation; appropriate affect; normal thought process Neuro: Sensation intact throughout. No gross coordination deficits.   Ortho Exam - Cervical: No midline spinous process TTP.  Full range of motion with neck flexion and extension.  There is worsening of trapezius pain with extension.  Negative Spurling's test, 5/5 strength of bilateral upper extremities.  - Left shoulder: There is significant hypertonicity as well as associated pain and trigger points palpating in the mid belly of the left trapezius musculature.  This is much more hypertonic than his right side.  There is some mild pain palpating over the superior aspect of the [facility] joint, no other bony TTP.  Rotator cuff testing is intact, negative provocative testing otherwise noted.  Imaging: XR Shoulder Left  Result Date: 05/13/2023 3  views of the left shoulder including Grashey, scapular Y and axial view were ordered and reviewed by myself.  There are some mild spurring from likely [facility] joint arthropathy, humeral head is well located without significant arthritic change.  No other acute bony abnormality noted.  XR Cervical Spine 2 or 3 views  Result Date: 05/13/2023 2 views of the cervical spine including AP and lateral film were ordered and reviewed by myself.  X-rays demonstrate no significant bony arthritic change, no fracture, and preserved intervertebral disc spaces.   Past Medical/Family/Surgical/Social History: Medications & Allergies reviewed per EMR, new medications updated. Patient Active Problem List   Diagnosis Date Noted   Rectal bleeding 06/15/2020   Anal itching 06/15/2020   Non-traumatic rhabdomyolysis    Rhabdomyolysis 05/11/2015   Tobacco abuse 05/11/2015   Past Medical History:  Diagnosis Date   Abscess 04/13/2013   Family History  Problem Relation Age of Onset   Diabetes Mother    Asthma Brother    Colon cancer Neg Hx    Esophageal cancer Neg Hx  Rectal cancer Neg Hx    Stomach cancer Neg Hx    Past Surgical History:  Procedure Laterality Date   APPENDECTOMY     INCISION AND DRAINAGE PERIRECTAL ABSCESS N/A 04/13/2013   Procedure: IRRIGATION AND DEBRIDEMENT PERIRECTAL ABSCESS;  Surgeon: Atilano Ina, MD;  Location: [facility] OR;  Service: General;  Laterality: N/A;   Social History   Occupational History   Not on file  Tobacco Use   Smoking status: Former    Current packs/day: 0.00    Types: Cigarettes    Quit date: 10/29/2017    Years since quitting: 5.5   Smokeless tobacco: Never  Vaping Use   Vaping status: Some Days  Substance and Sexual Activity   Alcohol use: No   Drug use: No   Sexual activity: Yes

## 2023-05-27 ENCOUNTER — Ambulatory Visit (INDEPENDENT_AMBULATORY_CARE_PROVIDER_SITE_OTHER): Payer: Medicaid Other | Admitting: Sports Medicine

## 2023-05-27 ENCOUNTER — Other Ambulatory Visit: Payer: Self-pay

## 2023-05-27 DIAGNOSIS — M25512 Pain in left shoulder: Secondary | ICD-10-CM | POA: Diagnosis not present

## 2023-05-27 DIAGNOSIS — G8929 Other chronic pain: Secondary | ICD-10-CM | POA: Diagnosis not present

## 2023-05-27 DIAGNOSIS — R293 Abnormal posture: Secondary | ICD-10-CM

## 2023-05-27 DIAGNOSIS — S4352XA Sprain of left acromioclavicular joint, initial encounter: Secondary | ICD-10-CM | POA: Diagnosis not present

## 2023-05-27 MED ORDER — LIDOCAINE HCL 1 % IJ SOLN
0.5000 mL | INTRAMUSCULAR | Status: AC | PRN
Start: 2023-05-27 — End: 2023-05-27
  Administered 2023-05-27: .5 mL

## 2023-05-27 MED ORDER — METHYLPREDNISOLONE ACETATE 40 MG/ML IJ SUSP
40.0000 mg | INTRAMUSCULAR | Status: AC | PRN
Start: 2023-05-27 — End: 2023-05-27
  Administered 2023-05-27: 40 mg via INTRA_ARTICULAR

## 2023-05-27 MED ORDER — BUPIVACAINE HCL 0.25 % IJ SOLN
2.0000 mL | INTRAMUSCULAR | Status: AC | PRN
Start: 2023-05-27 — End: 2023-05-27
  Administered 2023-05-27: 2 mL via INTRA_ARTICULAR

## 2023-05-27 MED ORDER — LIDOCAINE HCL 1 % IJ SOLN
2.0000 mL | INTRAMUSCULAR | Status: AC | PRN
Start: 2023-05-27 — End: 2023-05-27
  Administered 2023-05-27: 2 mL

## 2023-05-27 NOTE — Progress Notes (Signed)
Patient was instructed in 10 minutes of therapeutic exercises for left shoulder to improve strength, ROM and function according to my instructions and plan of care by a Certified Athletic Trainer during the office visit. A customized handout was provided and demonstration of proper technique shown and discussed. Patient did perform exercises and demonstrate understanding through teachback.  All questions discussed and answered.  

## 2023-05-27 NOTE — Progress Notes (Signed)
Devin Marsh - 37 y.o. male MRN [id number]  Date of birth: [birth year]  Office Visit Note: Visit Date: 05/27/2023 PCP: Grayce Sessions, NP Referred by: Grayce Sessions, NP  Subjective: Chief Complaint  Patient presents with   Left Shoulder - Follow-up   HPI: Devin Marsh is a pleasant 37 y.o. male who presents today for follow-up of left shoulder pain.  Patient has notable past medical history of multiple sclerosis. He is in PT for his MS/balance issues.  Saw him a few weeks ago and did perform trigger point injections disease in the shoulder.  Does not think this has been significantly helpful.  He does state this initially helped with the swelling and with the pain somewhat beginning but feels like his pain has now returned.  Noticing also more down into the lateral shoulder.  No longer taking meloxicam.  Pertinent ROS were reviewed with the patient and found to be negative unless otherwise specified above in HPI.   Assessment & Plan: Visit Diagnoses:  1. Chronic left shoulder pain   2. Arthralgia of left acromioclavicular joint   3. Abnormal posture    Plan: Discussed with [name] the nature of his left shoulder pain which I think is multifactorial.  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  He also has reciprocal changes in the trapezius and the posterior scapula with tightness that I think are compensatory from his underlying shoulder pain.  Through shared decision making did proceed with ultrasound-guided [facility] joint injection as well as subacromial joint injection for the shoulder.  He will use ice and/or Tylenol/Motrin.  For any postinjection pain.  After 48-72 hours of modified rest, I would like him to begin his home exercise therapy.  He preferred home therapy versus formal PT.  We did print out a customized exercises for his shoulder as well as his scapular stabilization, my  athletic trainer Lequita Halt did review these in the room with him today.  He will perform these once daily.  He will notify me of his improvement with the injections and beginning therapy over the next 7-10 days, this will help guide management.  We could always consider formalized physical therapy as well.  He will follow-up with me in 6 weeks, may call or return sooner if any issues arise.  For for some reason he is not getting good relief, next step would be consideration of MRI of the shoulder; low threshold for cervical MRI.  Follow-up: Return in about 6 weeks (around 07/08/2023) for will notify me in about 7-10 days of pain after injection.   Meds & Orders: No orders of the defined types were placed in this encounter.   Orders Placed This Encounter  Procedures   Large Joint Inj   Medium Joint Inj   US Guided Needle Placement - No Linked Charges     Procedures: Large Joint Inj: L subacromial bursa on 05/27/2023 10:31 AM Indications: pain Details: 22 G 1.5 in needle, posterior approach Medications: 2 mL lidocaine 1 %; 2 mL bupivacaine 0.25 %; 40 mg methylPREDNISolone acetate 40 MG/ML Outcome: tolerated well, no immediate complications  Subacromial Joint Injection, Right Shoulder After discussion on risks/benefits/indications, informed verbal consent was obtained. A timeout was then performed. Patient was seated on table in exam room. The patient's shoulder was prepped with betadine and alcohol swabs and utilizing posterior approach a 22G, 1.5" needle was directed anteriorly and laterally into the  patient's subacromial space was injected with 2:2:1 mixture of lidocaine:bupivicaine:depomedrol with appreciation of free-flowing of the injectate into the bursal space. Patient tolerated the procedure well without immediate complications.   Procedure, treatment alternatives, risks and benefits explained, specific risks discussed. Consent was given by the patient. Immediately prior to procedure a time  out was called to verify the correct patient, procedure, equipment, support staff and site/side marked as required. Patient was prepped and draped in the usual sterile fashion.    Medium Joint Inj: L acromioclavicular on 05/27/2023 10:32 AM Indications: pain Details: 25 G 1.5 in needle, ultrasound-guided anterior approach Medications: 0.5 mL lidocaine 1 %; 40 mg methylPREDNISolone acetate 40 MG/ML  US-guided AC Joint injection, left shoulder After discussion on risks/benefits/indications, informed verbal consent was obtained. A timeout was then performed. The patient was seated in examination room. The area overlying the [facility] joint of the shoulder was prepped with Betadine and alcohol swab then utilizing ultrasound guidance, patient's AC joint was injected using a 25G, 1.5" needle with 0.5:1.53mL lidocaine:depomedrol of injectate via an out-of-plane, walk-down approach. Visualization of injectate flow was noted under ultrasound guidance. Patient tolerated the procedure well without immediate complications.   Procedure, treatment alternatives, risks and benefits explained, specific risks discussed. Consent was given by the patient. Immediately prior to procedure a time out was called to verify the correct patient, procedure, equipment, support staff and site/side marked as required. Patient was prepped and draped in the usual sterile fashion.         Clinical History: No specialty comments available.  He reports that he quit smoking about 5 years ago. His smoking use included cigarettes. He has never used smokeless tobacco.  Recent Labs    04/25/23 1130  HGBA1C 6.4    Objective:   Vital Signs: There were no vitals taken for this visit.  Physical Exam  Gen: Well-appearing, in no acute distress; non-toxic CV: Well-perfused. Warm.  Resp: Breathing unlabored on room air; no wheezing. Psych: Fluid speech in conversation; appropriate affect; normal thought process Neuro: Sensation intact  throughout. No gross coordination deficits.   Ortho Exam - Left shoulder: Mild TTP over the [facility] joint, there is hypertonicity of the left trapezius muscle. Positive pain at Codman's point.  There is full active and passive range of motion, positive drop arm test, positive empty can and resisted ER.   Imaging: No results found.  Past Medical/Family/Surgical/Social History: Medications & Allergies reviewed per EMR, new medications updated. Patient Active Problem List   Diagnosis Date Noted   Rectal bleeding 06/15/2020   Anal itching 06/15/2020   Non-traumatic rhabdomyolysis    Rhabdomyolysis 05/11/2015   Tobacco abuse 05/11/2015   Past Medical History:  Diagnosis Date   Abscess 04/13/2013   Family History  Problem Relation Age of Onset   Diabetes Mother    Asthma Brother    Colon cancer Neg Hx    Esophageal cancer Neg Hx    Rectal cancer Neg Hx    Stomach cancer Neg Hx    Past Surgical History:  Procedure Laterality Date   APPENDECTOMY     INCISION AND DRAINAGE PERIRECTAL ABSCESS N/A 04/13/2013   Procedure: IRRIGATION AND DEBRIDEMENT PERIRECTAL ABSCESS;  Surgeon: Atilano Ina, MD;  Location: [facility] OR;  Service: General;  Laterality: N/A;   Social History   Occupational History   Not on file  Tobacco Use   Smoking status: Former    Current packs/day: 0.00    Types: Cigarettes    Quit date: 10/29/2017  Years since quitting: 5.5   Smokeless tobacco: Never  Vaping Use   Vaping status: Some Days  Substance and Sexual Activity   Alcohol use: No   Drug use: No   Sexual activity: Yes

## 2023-07-08 ENCOUNTER — Ambulatory Visit: Payer: Medicaid Other | Admitting: Sports Medicine

## 2023-07-08 DIAGNOSIS — M25512 Pain in left shoulder: Secondary | ICD-10-CM | POA: Diagnosis not present

## 2023-07-08 DIAGNOSIS — M62838 Other muscle spasm: Secondary | ICD-10-CM

## 2023-07-08 DIAGNOSIS — G8929 Other chronic pain: Secondary | ICD-10-CM | POA: Diagnosis not present

## 2023-07-08 DIAGNOSIS — G35 Multiple sclerosis: Secondary | ICD-10-CM

## 2023-07-08 NOTE — Progress Notes (Signed)
Patient is following up for left shoulder pain. He states that the injection helped, and he is doing a combination of physical therapy and home exercises that he believes is helping.

## 2023-07-08 NOTE — Progress Notes (Signed)
Devin Marsh - 37 y.o. male MRN [id number]  Date of birth: 1986-05-15  Office Visit Note: Visit Date: 07/08/2023 PCP: Grayce Sessions, NP Referred by: Grayce Sessions, NP  Subjective: Chief Complaint  Patient presents with   Left Shoulder - Follow-up, Pain   HPI: Devin Marsh is a pleasant 36 y.o. male who presents today for Follow-up of left shoulder pain.  Devin Marsh states that he is doing much better, feels like his shoulder is at least 75-80% improved.  We did proceed with subacromial and ultrasound-guided left AC joint injection on 05/27/2023.  This took for 5 days to kick in but after about a week he noticed good improvement.  He still has some tightness in the left trapezius but also notes this is much improved after the injection as well as doing both home therapy and formalized physical therapy.  He is doing PT once weekly.  Is not requiring much medication, has been prescribed meloxicam 15 mg in the past but is not requiring this.  Takes very occasional over-the-counter Tylenol, or Motrin.  Pertinent ROS were reviewed with the patient and found to be negative unless otherwise specified above in HPI.   Assessment & Plan: Visit Diagnoses:  1. Chronic left shoulder pain   2. Arthralgia of left acromioclavicular joint   3. Trapezius muscle spasm   4. Multiple sclerosis (HCC)    Plan: Both [name] and I are pleased with the improvements he has made with his left shoulder pain as well as his left trapezius musculature and hypertonicity. His posture has improved as well with both formal physical therapy and home rehab exercises we provided him.  I would like him to continue his formal physical therapy as he has about a month to 6 weeks left as well as continuing his home rehab exercises, scapular stabilization and postural changes.  Given his improvement, he will discontinue meloxicam 15mg  indefinitely. Would caution his use of Motrin and NSAIDs as well, may use heat to the  shoulder as well as Tylenol for as needed pain.  I would like to follow-up in about 6 weeks after he completes formalized physical therapy to see further of his improvement.  If he continues making progress, we will allow him to transition to HEP, if for some reason he is having still some pain or difficulty with the shoulder, next step could be considering MRI of the shoulder. F/u in 6 weeks.  Follow-up: Return in about 6 weeks (around 08/19/2023) for L-shoulder .   Meds & Orders: No orders of the defined types were placed in this encounter.  No orders of the defined types were placed in this encounter.    Procedures: No procedures performed      Clinical History: No specialty comments available.  He reports that he quit smoking about 5 years ago. His smoking use included cigarettes. He has never used smokeless tobacco.  Recent Labs    04/25/23 1130  HGBA1C 6.4    Objective:    Physical Exam  Gen: Well-appearing, in no acute distress; non-toxic CV:  Well-perfused. Warm.  Resp: Breathing unlabored on room air; no wheezing. Psych: Fluid speech in conversation; appropriate affect; normal thought process Neuro: Sensation intact throughout. No gross coordination deficits.   Ortho Exam - Left shoulder/trapezius: No TTP over the [facility] joint or the shoulder.  There is full active and passive range of motion, improved strength and no pain with empty can or resisted ER today.  There is still some mild hypertonicity  of the left trapezius muscle but no longer any swelling or significant pain.  There is 5/5 strength of the rotator cuff testing today.  Imaging: No results found.  Past Medical/Family/Surgical/Social History: Medications & Allergies reviewed per EMR, new medications updated. Patient Active Problem List   Diagnosis Date Noted   Rectal bleeding 06/15/2020   Anal itching 06/15/2020   Non-traumatic rhabdomyolysis    Rhabdomyolysis 05/11/2015   Tobacco abuse 05/11/2015   Past  Medical History:  Diagnosis Date   Abscess 04/13/2013   Family History  Problem Relation Age of Onset   Diabetes Mother    Asthma Brother    Colon cancer Neg Hx    Esophageal cancer Neg Hx    Rectal cancer Neg Hx    Stomach cancer Neg Hx    Past Surgical History:  Procedure Laterality Date   APPENDECTOMY     INCISION AND DRAINAGE PERIRECTAL ABSCESS N/A 04/13/2013   Procedure: IRRIGATION AND DEBRIDEMENT PERIRECTAL ABSCESS;  Surgeon: Atilano Ina, MD;  Location: [facility] OR;  Service: General;  Laterality: N/A;   Social History   Occupational History   Not on file  Tobacco Use   Smoking status: Former    Current packs/day: 0.00    Types: Cigarettes    Quit date: 10/29/2017    Years since quitting: 5.6   Smokeless tobacco: Never  Vaping Use   Vaping status: Some Days  Substance and Sexual Activity   Alcohol use: No   Drug use: No   Sexual activity: Yes

## 2023-07-17 ENCOUNTER — Ambulatory Visit: Payer: BC Managed Care – PPO | Admitting: Gastroenterology

## 2023-07-30 ENCOUNTER — Ambulatory Visit (INDEPENDENT_AMBULATORY_CARE_PROVIDER_SITE_OTHER): Payer: BC Managed Care – PPO | Admitting: Primary Care

## 2023-08-16 ENCOUNTER — Ambulatory Visit: Payer: BC Managed Care – PPO | Admitting: Sports Medicine

## 2023-08-19 ENCOUNTER — Ambulatory Visit: Payer: BC Managed Care – PPO | Admitting: Sports Medicine

## 2023-09-03 ENCOUNTER — Ambulatory Visit: Payer: BC Managed Care – PPO | Admitting: Sports Medicine

## 2023-09-04 ENCOUNTER — Encounter: Payer: Self-pay | Admitting: Sports Medicine

## 2023-09-06 ENCOUNTER — Ambulatory Visit: Payer: BC Managed Care – PPO | Admitting: Sports Medicine

## 2023-09-11 ENCOUNTER — Ambulatory Visit: Payer: Medicaid Other | Admitting: Sports Medicine

## 2023-09-11 ENCOUNTER — Encounter: Payer: Self-pay | Admitting: Sports Medicine

## 2023-09-11 DIAGNOSIS — G8929 Other chronic pain: Secondary | ICD-10-CM | POA: Diagnosis not present

## 2023-09-11 DIAGNOSIS — G35 Multiple sclerosis: Secondary | ICD-10-CM | POA: Insufficient documentation

## 2023-09-11 DIAGNOSIS — M25512 Pain in left shoulder: Secondary | ICD-10-CM | POA: Diagnosis not present

## 2023-09-11 DIAGNOSIS — M12812 Other specific arthropathies, not elsewhere classified, left shoulder: Secondary | ICD-10-CM | POA: Diagnosis not present

## 2023-09-11 MED ORDER — MELOXICAM 15 MG PO TABS: 15.0000 mg | ORAL_TABLET | Freq: Every day | ORAL | 0 refills | Status: DC

## 2023-09-11 NOTE — Progress Notes (Signed)
Patient says that the injection did help to get rid of his left shoulder pain for a time, but that it has returned in the last week or so. He says that he was going to physical therapy every other week and has just finished with those appointments. He says that he has sharp pain when moving his arm up, and then it is constant the rest of the time. He also says that he feels it whenever he drops his head towards the right shoulder. He says that he takes Tylenol as needed, but it only gives him temporary relief.

## 2023-09-11 NOTE — Progress Notes (Signed)
Devin Marsh - 37 y.o. male MRN [id number]  Date of birth: [birth year]  Office Visit Note: Visit Date: 09/11/2023 PCP: Devin Sessions, NP Referred by: Devin Sessions, NP  Subjective: Chief Complaint  Patient presents with   Left Shoulder - Follow-up, Pain   HPI: Devin Marsh is a pleasant 37 y.o. male who presents today for follow-up of acute on chronic left shoulder pain.  His shoulder pain has started to return over the last 1-2 weeks.  We did proceed with AC joint and subacromial injection back on 05/27/2023 which did give him good relief for few months.  Pain continues over the superior aspect of the shoulder as well as pointing to the proximal deltoid and lateral shoulder.  He has been doing formalized physical therapy for many months as well as going through some of the home exercises we showed him.  His pain is fairly constant all the time but does have pain when moving the arm up above his head and across his body.  Did get some relief from meloxicam in the past, but he is out of this and taking Tylenol as needed.  Pertinent ROS were reviewed with the patient and found to be negative unless otherwise specified above in HPI.   Assessment & Plan: Visit Diagnoses:  1. Chronic left shoulder pain   2. Arthralgia of left acromioclavicular joint   3. Rotator cuff arthropathy of left shoulder   4. Multiple sclerosis (HCC)    Plan: Discussed with Devin Marsh the possible etiologies of his acute on chronic left shoulder pain which his exam points to both the [facility] joint as well as the rotator cuff being his pathology.  He does have some mild arthritic change about the [facility] joint on x-ray, which I am somewhat concerned for possible osteolysis in this location.  He does have multiple sclerosis with may be contributing to his rotator cuff or proximal shoulder muscles.  Given the chronicity and recurrence, I think we need to obtain an MRI to evaluate the rotator cuff, AC joint and other  intra-articular structures, this was ordered today.  For pain control we will start him back on meloxicam 15 mg once daily.  He may continue his PT/HEP until this time.  We will follow-up in 1 week after MRI to review and discuss next steps.  Follow-up: Return for f/u 1-week after MRI L-shoulder to review.   Meds & Orders:  Meds ordered this encounter  Medications   meloxicam (MOBIC) 15 MG tablet    Sig: Take 1 tablet (15 mg total) by mouth daily.    Dispense:  30 tablet    Refill:  0    Orders Placed This Encounter  Procedures   MR Shoulder Left w/o contrast     Procedures: No procedures performed      Clinical History: No specialty comments available.  He reports that he quit smoking about 5 years ago. His smoking use included cigarettes. He has never used smokeless tobacco.  Recent Labs    04/25/23 1130  HGBA1C 6.4    Objective:   Physical Exam  Gen: Well-appearing, in no acute distress; non-toxic CV:  Well-perfused. Warm.  Resp: Breathing unlabored on room air; no wheezing. Psych: Fluid speech in conversation; appropriate affect; normal thought process Neuro: Sensation intact throughout. No gross coordination deficits.   Ortho Exam - Left shoulder: Positive TTP over the [facility] joint as well as at Codman's point.  There is full active and passive range of motion although  pain with endrange abduction.  Positive crossarm adduction, positive scarf test.  Mildly positive empty can.  No gross weakness with rotator cuff testing albeit with mild pain.  Imaging: No results found.  Past Medical/Family/Surgical/Social History: Medications & Allergies reviewed per EMR, new medications updated. Patient Active Problem List   Diagnosis Date Noted   Multiple sclerosis (HCC) 09/11/2023   Rectal bleeding 06/15/2020   Anal itching 06/15/2020   Non-traumatic rhabdomyolysis    Rhabdomyolysis 05/11/2015   Tobacco abuse 05/11/2015   Past Medical History:  Diagnosis Date   Abscess  04/13/2013   Family History  Problem Relation Age of Onset   Diabetes Mother    Asthma Brother    Colon cancer Neg Hx    Esophageal cancer Neg Hx    Rectal cancer Neg Hx    Stomach cancer Neg Hx    Past Surgical History:  Procedure Laterality Date   APPENDECTOMY     INCISION AND DRAINAGE PERIRECTAL ABSCESS N/A 04/13/2013   Procedure: IRRIGATION AND DEBRIDEMENT PERIRECTAL ABSCESS;  Surgeon: Atilano Ina, MD;  Location: [facility] OR;  Service: General;  Laterality: N/A;   Social History   Occupational History   Not on file  Tobacco Use   Smoking status: Former    Current packs/day: 0.00    Types: Cigarettes    Quit date: 10/29/2017    Years since quitting: 5.8   Smokeless tobacco: Never  Vaping Use   Vaping status: Some Days  Substance and Sexual Activity   Alcohol use: No   Drug use: No   Sexual activity: Yes

## 2023-09-18 ENCOUNTER — Ambulatory Visit (INDEPENDENT_AMBULATORY_CARE_PROVIDER_SITE_OTHER): Payer: Medicaid Other | Admitting: Primary Care

## 2023-09-30 ENCOUNTER — Telehealth (INDEPENDENT_AMBULATORY_CARE_PROVIDER_SITE_OTHER): Payer: Self-pay | Admitting: Primary Care

## 2023-09-30 NOTE — Telephone Encounter (Signed)
Called to remind pt about apt. Vm left

## 2023-10-01 ENCOUNTER — Ambulatory Visit (INDEPENDENT_AMBULATORY_CARE_PROVIDER_SITE_OTHER): Payer: Medicaid Other | Admitting: Primary Care

## 2023-10-02 ENCOUNTER — Encounter: Payer: Self-pay | Admitting: Sports Medicine

## 2023-10-05 ENCOUNTER — Ambulatory Visit
Admission: RE | Admit: 2023-10-05 | Discharge: 2023-10-05 | Disposition: A | Discharge status: Home / Self Care | Payer: Medicaid Other | Source: Ambulatory Visit | Attending: Sports Medicine | Admitting: Sports Medicine

## 2023-10-05 DIAGNOSIS — G8929 Other chronic pain: Secondary | ICD-10-CM

## 2023-10-05 DIAGNOSIS — M25512 Pain in left shoulder: Secondary | ICD-10-CM

## 2023-10-05 DIAGNOSIS — M12812 Other specific arthropathies, not elsewhere classified, left shoulder: Secondary | ICD-10-CM

## 2023-10-15 ENCOUNTER — Encounter: Payer: Self-pay | Admitting: Sports Medicine

## 2023-10-15 ENCOUNTER — Ambulatory Visit (INDEPENDENT_AMBULATORY_CARE_PROVIDER_SITE_OTHER): Payer: Medicaid Other | Admitting: Sports Medicine

## 2023-10-15 DIAGNOSIS — M25522 Pain in left elbow: Secondary | ICD-10-CM | POA: Diagnosis not present

## 2023-10-15 DIAGNOSIS — M12812 Other specific arthropathies, not elsewhere classified, left shoulder: Secondary | ICD-10-CM | POA: Diagnosis not present

## 2023-10-15 DIAGNOSIS — G8929 Other chronic pain: Secondary | ICD-10-CM

## 2023-10-15 DIAGNOSIS — M25512 Pain in left shoulder: Secondary | ICD-10-CM | POA: Diagnosis not present

## 2023-10-15 NOTE — Progress Notes (Unsigned)
Devin Marsh - 37 y.o. male MRN [id number]  Date of birth: 1986/05/09  Office Visit Note: Visit Date: 10/15/2023 PCP: Grayce Sessions, NP Referred by: Grayce Sessions, NP  Subjective: Chief Complaint  Patient presents with   Left Shoulder - Follow-up   HPI: Devin Marsh is a pleasant 37 y.o. male who presents today for acute on chronic left shoulder pain with MRI review.  Also reporting left elbow pain.  Left shoulder -he has had left shoulder pain for a number of months now.  Back on 05/27/2023 we did proceed with ultrasound-guided [facility] joint injection as well as a subacromial joint injection which she feels like helped at least 75-80% but never fully got rid of his pain.  He has been doing both formalized physical therapy as well as home exercises which help with shoulder stability but he still remains with pain more so over the [facility] joint.  Has used meloxicam 15 mg as needed.  Over the last 2 weeks he has also had some elbow pain on the lateral and posterior side.  No specific injury.  Hurts if he raises above the arm and with flexion/extension.  There is no swelling.  Pertinent ROS were reviewed with the patient and found to be negative unless otherwise specified above in HPI.   Assessment & Plan: Visit Diagnoses:  1. Arthralgia of left acromioclavicular joint   2. Chronic left shoulder pain   3. Rotator cuff arthropathy of left shoulder   4. Pain in left elbow    Plan: Impression is recurrent left shoulder pain in the setting of symptomatic AC joint arthralgia and subcortical marrow edema with a degree of subacromial impingement.  He has received about 75-80% relief from both ultrasound-guided [facility] joint injection and SAJ injection, but still has pain despite this and both formal PT and HEP.  Did discuss possibly trialing an additional ultrasound-guided [facility] joint injection, but he is more interested in a permanent fix.  At this point, I would like him to see my partner, Dr.  August Saucer, for evaluation of possible distal clavicle excision plus or minus possible subacromial decompression.  His MRI shows some tendinopathy but no gross rotator cuff tearing.  He will continue his shoulder rehab exercises in the meantime.  Also may continue meloxicam 15 mg daily as needed for this as well as his newer onset left elbow pain which seems more muscular/overuse in nature.  Follow-up: Return for make an appt with Dr. August Saucer for eval of Left shoulder and AC-joint.   Meds & Orders: No orders of the defined types were placed in this encounter.  No orders of the defined types were placed in this encounter.   Procedures: No procedures performed      Clinical History: No specialty comments available.  He reports that he quit smoking about 5 years ago. His smoking use included cigarettes. He has never used smokeless tobacco.  Recent Labs    04/25/23 1130  HGBA1C 6.4    Objective:    Physical Exam  Gen: Well-appearing, in no acute distress; non-toxic CV: Well-perfused. Warm.  Resp: Breathing unlabored on room air; no wheezing. Psych: Fluid speech in conversation; appropriate affect; normal thought process Neuro: Sensation intact throughout. No gross coordination deficits.   Ortho Exam - Left shoulder:  - Left elbow:   Imaging:  MR Shoulder Left w/o contrast CLINICAL DATA:  Left shoulder pain with reduced range of motion over the last 2 years.  EXAM: MRI OF THE LEFT SHOULDER WITHOUT  CONTRAST  TECHNIQUE: Multiplanar, multisequence MR imaging of the shoulder was performed. No intravenous contrast was administered.  COMPARISON:  Radiographs 05/13/2023  FINDINGS: Rotator cuff:  Moderate supraspinatus tendinopathy anteriorly.  Muscles:  Unremarkable  Biceps long head:  Unremarkable  Acromioclavicular Joint: Moderate degenerative spurring and subcortical marrow edema. Type II acromion. Mild subacromial subdeltoid bursitis.  Glenohumeral Joint: Mild degenerative  chondral thinning.  Labrum:  Grossly unremarkable  Bones: No significant extra-articular osseous abnormalities identified.  Other: No supplemental non-categorized findings.  IMPRESSION: 1. Moderate supraspinatus tendinopathy anteriorly. 2. Moderate degenerative spurring and subcortical marrow edema at the acromioclavicular joint. 3. Mild subacromial subdeltoid bursitis. 4. Mild degenerative chondral thinning in the glenohumeral joint.  Electronically Signed   By: Gaylyn Rong M.D.   On: 10/14/2023 16:16  Past Medical/Family/Surgical/Social History: Medications & Allergies reviewed per EMR, new medications updated. Patient Active Problem List   Diagnosis Date Noted   Multiple sclerosis (HCC) 09/11/2023   Rectal bleeding 06/15/2020   Anal itching 06/15/2020   Non-traumatic rhabdomyolysis    Rhabdomyolysis 05/11/2015   Tobacco abuse 05/11/2015   Past Medical History:  Diagnosis Date   Abscess 04/13/2013   Family History  Problem Relation Age of Onset   Diabetes Mother    Asthma Brother    Colon cancer Neg Hx    Esophageal cancer Neg Hx    Rectal cancer Neg Hx    Stomach cancer Neg Hx    Past Surgical History:  Procedure Laterality Date   APPENDECTOMY     INCISION AND DRAINAGE PERIRECTAL ABSCESS N/A 04/13/2013   Procedure: IRRIGATION AND DEBRIDEMENT PERIRECTAL ABSCESS;  Surgeon: Atilano Ina, MD;  Location: [facility] OR;  Service: General;  Laterality: N/A;   Social History   Occupational History   Not on file  Tobacco Use   Smoking status: Former    Current packs/day: 0.00    Types: Cigarettes    Quit date: 10/29/2017    Years since quitting: 5.9   Smokeless tobacco: Never  Vaping Use   Vaping status: Some Days  Substance and Sexual Activity   Alcohol use: No   Drug use: No   Sexual activity: Yes

## 2023-10-15 NOTE — Progress Notes (Unsigned)
Patient says that he also began having pain in the left elbow about a week and a half ago. He says that his pain is in the back of the elbow and only hurts if he raises his arm and bends it. He does say that if he puts pressure over it while moving that it goes away.

## 2023-10-27 ENCOUNTER — Other Ambulatory Visit: Payer: Self-pay | Admitting: Sports Medicine

## 2023-10-29 ENCOUNTER — Ambulatory Visit (INDEPENDENT_AMBULATORY_CARE_PROVIDER_SITE_OTHER): Payer: Medicaid Other | Admitting: Primary Care

## 2023-11-07 ENCOUNTER — Ambulatory Visit (INDEPENDENT_AMBULATORY_CARE_PROVIDER_SITE_OTHER): Payer: Medicaid Other | Admitting: Primary Care

## 2023-11-11 ENCOUNTER — Encounter: Payer: Medicaid Other | Admitting: Orthopedic Surgery

## 2023-12-04 ENCOUNTER — Encounter: Payer: Self-pay | Admitting: Orthopedic Surgery

## 2023-12-04 ENCOUNTER — Ambulatory Visit: Payer: Medicaid Other | Admitting: Orthopedic Surgery

## 2023-12-04 DIAGNOSIS — M25512 Pain in left shoulder: Secondary | ICD-10-CM

## 2023-12-04 NOTE — Progress Notes (Signed)
 Office Visit Note   Patient: Devin Marsh           Date of Birth: [birth year]           MRN: [id number] Visit Date: 12/04/2023 Requested by: Celestia Rosaline SQUIBB, NP [address] [place],  [place] [address] PCP: Celestia Rosaline SQUIBB, NP  Subjective: Chief Complaint  Patient presents with   Left Shoulder - Pain    HPI: Devin Marsh is a 38 y.o. male who presents to the office reporting left shoulder pain.  MRI of the left shoulder demonstrates AC joint arthritis and marrow edema in the distal clavicle.  Patient did have an ultrasound-guided injection into the subacromial space and AC joint on 04/2023 which gave him 75% relief for several months.  Pain has been ongoing for 2 years.  Patient has MS.  Has some occasional radicular pain but not below the elbow.  He is disabled.  Cannot sleep on the left-hand side.  He has had physical therapy as well..                ROS: All systems reviewed are negative as they relate to the chief complaint within the history of present illness.  Patient denies fevers or chills.  Assessment & Plan: Visit Diagnoses:  1. 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  With this constellation of findings I think he has a very good chance of getting improvement arthroscopic distal clavicle excision.  Risk and benefits are discussed including not limited to infection nerve vessel damage incomplete pain weak as well as incomplete restoration of function.  The expected duration of rehabilitation is also discussed.  All questions answered.  Follow-Up Instructions: No follow-ups on file.   Orders:  No orders of the defined types were placed in this encounter.  No orders of the defined types were placed in this encounter.     Procedures: No procedures performed   Clinical Data: No additional  findings.  Objective: Vital Signs: There were no vitals taken for this visit.  Physical Exam:  Constitutional: Patient appears well-developed HEENT:  Head: Normocephalic Eyes:EOM are normal Neck: Normal range of motion Cardiovascular: Normal rate Pulmonary/chest: Effort normal Neurologic: Patient is alert Skin: Skin is warm Psychiatric: Patient has normal mood and affect  Ortho Exam: Ortho exam demonstrates tenderness to palpation in that [facility] joint on the left but less on the right.  Rotator cuff strength is intact.  Passive range of motion also intact with no loss asymmetrically on the left side versus right.  No intra-articular coarseness or grinding with passive range of motion.  Radial pulse intact bilaterally.  O'Brien's testing is equivocal on the left negative on the right.  Specialty Comments:  No specialty comments available.  Imaging: No results found.   PMFS History: Patient Active Problem List   Diagnosis Date Noted   Multiple sclerosis (HCC) 09/11/2023   Rectal bleeding 06/15/2020   Anal itching 06/15/2020   Non-traumatic rhabdomyolysis    Rhabdomyolysis 05/11/2015   Tobacco abuse 05/11/2015   Past Medical History:  Diagnosis Date   Abscess 04/13/2013    Family History  Problem Relation Age of Onset   Diabetes Mother    Asthma Brother    Colon cancer Neg Hx    Esophageal cancer Neg Hx    Rectal cancer Neg Hx  Stomach cancer Neg Hx     Past Surgical History:  Procedure Laterality Date   APPENDECTOMY     INCISION AND DRAINAGE PERIRECTAL ABSCESS N/A 04/13/2013   Procedure: IRRIGATION AND DEBRIDEMENT PERIRECTAL ABSCESS;  Surgeon: Camellia CHRISTELLA Blush, MD;  Location: [facility] OR;  Service: General;  Laterality: N/A;   Social History   Occupational History   Not on file  Tobacco Use   Smoking status: Former    Current packs/day: 0.00    Types: Cigarettes    Quit date: 10/29/2017    Years since quitting: 6.1   Smokeless tobacco: Never  Vaping Use   Vaping  status: Some Days  Substance and Sexual Activity   Alcohol use: No   Drug use: No   Sexual activity: Yes

## 2023-12-05 ENCOUNTER — Ambulatory Visit (INDEPENDENT_AMBULATORY_CARE_PROVIDER_SITE_OTHER): Payer: Medicaid Other | Admitting: Primary Care

## 2023-12-05 ENCOUNTER — Encounter: Payer: Self-pay | Admitting: Orthopedic Surgery

## 2023-12-31 NOTE — Pre-Procedure Instructions (Signed)
 Surgical Instructions   Your procedure is scheduled on Tuesday, March 11th. Report to [facility] Main Entrance "A" at 12:40 P.M., then check in with the Admitting office. Any questions or running late day of surgery: call [phone]  Questions prior to your surgery date: call [phone], Monday-Friday, 8am-4pm. If you experience any cold or flu symptoms such as cough, fever, chills, shortness of breath, etc. between now and your scheduled surgery, please notify us at the above number.     Remember:  Do not eat after midnight the night before your surgery   You may drink clear liquids until 11:40 AM the morning of your surgery.   Clear liquids allowed are: Water, Non-Citrus Juices (without pulp), Carbonated Beverages, Clear Tea (no milk, honey, etc.), Black Coffee Only (NO MILK, CREAM OR POWDERED CREAMER of any kind), and Gatorade.  Patient Instructions  The night before surgery:  No food after midnight. ONLY clear liquids after midnight  The day of surgery (if you do NOT have diabetes):  Drink ONE (1) Pre-Surgery Clear Ensure by 11:40 AM the morning of surgery. Drink in one sitting. Do not sip.  This drink was given to you during your hospital  pre-op appointment visit.  Nothing else to drink after completing the  Pre-Surgery Clear Ensure.          If you have questions, please contact your surgeon's office.    Take these medicines the morning of surgery with A SIP OF WATER  propranolol ER (INDERAL LA)    May take these medicines IF NEEDED: acetaminophen (TYLENOL)  gabapentin (NEURONTIN)    One week prior to surgery, STOP taking any Aspirin (unless otherwise instructed by your surgeon) Aleve, Naproxen, Ibuprofen, Motrin, Advil, Goody's, BC's, all herbal medications, fish oil, and non-prescription vitamins.                     Do NOT Smoke (Tobacco/Vaping) for 24 hours prior to your procedure.  If you use a CPAP at night, you may bring your mask/headgear for your  overnight stay.   You will be asked to remove any contacts, glasses, piercing's, hearing aid's, dentures/partials prior to surgery. Please bring cases for these items if needed.    Patients discharged the day of surgery will not be allowed to drive home, and someone needs to stay with them for 24 hours.  SURGICAL WAITING ROOM VISITATION Patients may have no more than 2 support people in the waiting area - these visitors may rotate.   Pre-op nurse will coordinate an appropriate time for 1 ADULT support person, who may not rotate, to accompany patient in pre-op.  Children under the age of 74 must have an adult with them who is not the patient and must remain in the main waiting area with an adult.  If the patient needs to stay at the hospital during part of their recovery, the visitor guidelines for inpatient rooms apply.  Please refer to the [facility] website for the visitor guidelines for any additional information.   If you received a COVID test during your pre-op visit  it is requested that you wear a mask when out in public, stay away from anyone that may not be feeling well and notify your surgeon if you develop symptoms. If you have been in contact with anyone that has tested positive in the last 10 days please notify you surgeon.      Pre-operative CHG Bathing Instructions   You can play a key role  in reducing the risk of infection after surgery. Your skin needs to be as free of germs as possible. You can reduce the number of germs on your skin by washing with CHG (chlorhexidine gluconate) soap before surgery. CHG is an antiseptic soap that kills germs and continues to kill germs even after washing.   DO NOT use if you have an allergy to chlorhexidine/CHG or antibacterial soaps. If your skin becomes reddened or irritated, stop using the CHG and notify one of our RNs at [phone].              TAKE A SHOWER THE NIGHT BEFORE SURGERY AND THE DAY OF SURGERY    Please keep in mind  the following:  DO NOT shave, including legs and underarms, 48 hours prior to surgery.   You may shave your face before/day of surgery.  Place clean sheets on your bed the night before surgery Use a clean washcloth (not used since being washed) for each shower. DO NOT sleep with pet's night before surgery.  CHG Shower Instructions:  Wash your face and private area with normal soap. If you choose to wash your hair, wash first with your normal shampoo.  After you use shampoo/soap, rinse your hair and body thoroughly to remove shampoo/soap residue.  Turn the water OFF and apply half the bottle of CHG soap to a CLEAN washcloth.  Apply CHG soap ONLY FROM YOUR NECK DOWN TO YOUR TOES (washing for 3-5 minutes)  DO NOT use CHG soap on face, private areas, open wounds, or sores.  Pay special attention to the area where your surgery is being performed.  If you are having back surgery, having someone wash your back for you may be helpful. Wait 2 minutes after CHG soap is applied, then you may rinse off the CHG soap.  Pat dry with a clean towel  Put on clean pajamas    Additional instructions for the day of surgery: DO NOT APPLY any lotions, deodorants, cologne, or perfumes.   Do not wear jewelry or makeup Do not wear nail polish, gel polish, artificial nails, or any other type of covering on natural nails (fingers and toes) Do not bring valuables to the hospital. [facility] is not responsible for valuables/personal belongings. Put on clean/comfortable clothes.  Please brush your teeth.  Ask your nurse before applying any prescription medications to the skin.

## 2024-01-01 ENCOUNTER — Encounter (HOSPITAL_COMMUNITY): Payer: Self-pay

## 2024-01-01 ENCOUNTER — Other Ambulatory Visit: Payer: Self-pay

## 2024-01-01 ENCOUNTER — Encounter (HOSPITAL_COMMUNITY)
Admission: RE | Admit: 2024-01-01 | Discharge: 2024-01-01 | Disposition: A | Discharge status: Home / Self Care | Payer: Medicaid Other | Source: Ambulatory Visit | Attending: Orthopedic Surgery | Admitting: Orthopedic Surgery

## 2024-01-01 VITALS — BP 126/89 | HR 79 | Temp 98.2°F | Resp 19 | Ht 69.0 in | Wt 281.4 lb

## 2024-01-01 DIAGNOSIS — Z6841 Body Mass Index (BMI) 40.0 and over, adult: Secondary | ICD-10-CM | POA: Diagnosis not present

## 2024-01-01 DIAGNOSIS — Z01818 Encounter for other preprocedural examination: Secondary | ICD-10-CM | POA: Insufficient documentation

## 2024-01-01 DIAGNOSIS — Z01812 Encounter for preprocedural laboratory examination: Secondary | ICD-10-CM | POA: Diagnosis present

## 2024-01-01 DIAGNOSIS — E669 Obesity, unspecified: Secondary | ICD-10-CM | POA: Diagnosis not present

## 2024-01-01 DIAGNOSIS — I10 Essential (primary) hypertension: Secondary | ICD-10-CM | POA: Diagnosis not present

## 2024-01-01 DIAGNOSIS — G35 Multiple sclerosis: Secondary | ICD-10-CM | POA: Diagnosis not present

## 2024-01-01 DIAGNOSIS — Z01810 Encounter for preprocedural cardiovascular examination: Secondary | ICD-10-CM | POA: Diagnosis present

## 2024-01-01 DIAGNOSIS — K219 Gastro-esophageal reflux disease without esophagitis: Secondary | ICD-10-CM | POA: Insufficient documentation

## 2024-01-01 DIAGNOSIS — R519 Headache, unspecified: Secondary | ICD-10-CM | POA: Diagnosis not present

## 2024-01-01 HISTORY — DX: Gastro-esophageal reflux disease without esophagitis: K21.9

## 2024-01-01 HISTORY — DX: Unspecified osteoarthritis, unspecified site: M19.90

## 2024-01-01 HISTORY — DX: Essential (primary) hypertension: I10

## 2024-01-01 HISTORY — DX: Multiple sclerosis: G35

## 2024-01-01 HISTORY — DX: Headache, unspecified: R51.9

## 2024-01-01 LAB — CBC
HCT: 45.2 % (ref 39.0–52.0)
Hemoglobin: 14.6 g/dL (ref 13.0–17.0)
MCH: 28.7 pg (ref 26.0–34.0)
MCHC: 32.3 g/dL (ref 30.0–36.0)
MCV: 89 fL (ref 80.0–100.0)
Platelets: 280 10*3/uL (ref 150–400)
RBC: 5.08 MIL/uL (ref 4.22–5.81)
RDW: 13.4 % (ref 11.5–15.5)
WBC: 9.6 10*3/uL (ref 4.0–10.5)
nRBC: 0 % (ref 0.0–0.2)

## 2024-01-01 LAB — BASIC METABOLIC PANEL
Anion gap: 8 (ref 5–15)
BUN: 11 mg/dL (ref 6–20)
CO2: 26 mmol/L (ref 22–32)
Calcium: 9.1 mg/dL (ref 8.9–10.3)
Chloride: 106 mmol/L (ref 98–111)
Creatinine, Ser: 1.11 mg/dL (ref 0.61–1.24)
GFR, Estimated: >60 mL/min (ref >60)
Glucose, Bld: 140 mg/dL — ABNORMAL HIGH (ref 70–99)
Potassium: 4 mmol/L (ref 3.5–5.1)
Sodium: 140 mmol/L (ref 135–145)

## 2024-01-01 NOTE — Progress Notes (Signed)
 PCP - Grayce Sessions, NP  Cardiologist -   PPM/ICD - Denies Device Orders - n/a Rep Notified - n/a  Chest x-ray - denies EKG - 01-01-24 Stress Test - denies ECHO - denies Cardiac Cath - denies  Sleep Study - denies CPAP - n/a  DM -denies  Blood Thinner Instructions:denies Aspirin Instructions:n/a  ERAS Protcol - Clear liquids until 11:40 PRE-SURGERY Ensure or G2-   COVID TEST- n/a   Anesthesia review: yes  Patient denies shortness of breath, fever, cough and chest pain at PAT appointment   All instructions explained to the patient, with a verbal understanding of the material. Patient agrees to go over the instructions while at home for a better understanding. Patient also instructed to self quarantine after being tested for COVID-19. The opportunity to ask questions was provided.

## 2024-01-02 ENCOUNTER — Encounter (HOSPITAL_COMMUNITY): Payer: Self-pay

## 2024-01-02 NOTE — Progress Notes (Signed)
 Anesthesia Chart Review:  38 year old male follows with neurology at [facility] for history of multiple sclerosis, symptom onset and diagnosis in 2022.  He is currently maintained on Kesimpta.  Last seen in follow-up on 10/14/2023, stable at that time, no changes to management, 59-month follow-up recommended.  Other pertinent history includes GERD, HTN, headaches, obesity (BMI 41).  Preop labs reviewed, unremarkable.  BP well-controlled, 126/89.  EKG 01/01/2024: NSR.  Rate 68.   Zannie Cove [facility] Stay [facility]/Anesthesiology Phone [phone] 01/02/2024 12:58 PM

## 2024-01-02 NOTE — Anesthesia Preprocedure Evaluation (Addendum)
 Anesthesia Evaluation  Patient identified by MRN, date of birth, ID band Patient awake    Reviewed: Allergy & Precautions, NPO status , Patient's Chart, lab work & pertinent test results, reviewed documented beta blocker date and time   Airway Mallampati: I  TM Distance: >3 FB Neck ROM: Full    Dental no notable dental hx. (+) Teeth Intact, Dental Advisory Given Upper and lower braces:   Pulmonary former smoker   Pulmonary exam normal breath sounds clear to auscultation       Cardiovascular hypertension, Pt. on home beta blockers and Pt. on medications Normal cardiovascular exam Rhythm:Regular Rate:Normal     Neuro/Psych  Headaches  Neuromuscular disease (MS, compliant with medication)  negative psych ROS   GI/Hepatic Neg liver ROS,GERD  ,,  Endo/Other    Class 3 obesity (BMI 42)  Renal/GU negative Renal ROS  negative genitourinary   Musculoskeletal  (+) Arthritis ,    Abdominal   Peds  Hematology negative hematology ROS (+)   Anesthesia Other Findings   Reproductive/Obstetrics                             Anesthesia Physical Anesthesia Plan  ASA: 3  Anesthesia Plan: General and Regional   Post-op Pain Management: Regional block* and Tylenol PO (pre-op)*   Induction: Intravenous  PONV Risk Score and Plan: 2 and Midazolam, Dexamethasone and Ondansetron  Airway Management Planned: Oral ETT  Additional Equipment:   Intra-op Plan:   Post-operative Plan: Extubation in OR  Informed Consent: I have reviewed the patients History and Physical, chart, labs and discussed the procedure including the risks, benefits and alternatives for the proposed anesthesia with the patient or authorized representative who has indicated his/her understanding and acceptance.     Dental advisory given  Plan Discussed with: CRNA  Anesthesia Plan Comments: ( )        Anesthesia Quick  Evaluation

## 2024-01-06 NOTE — Progress Notes (Signed)
 Patient informed of surgical time change. new arrival time of 1115 on 01/07/2024.

## 2024-01-07 ENCOUNTER — Encounter (HOSPITAL_COMMUNITY)
Admission: RE | Disposition: A | Discharge status: Home / Self Care | Payer: Self-pay | Source: Home / Self Care | Attending: Orthopedic Surgery

## 2024-01-07 ENCOUNTER — Other Ambulatory Visit: Payer: Self-pay

## 2024-01-07 ENCOUNTER — Ambulatory Visit (HOSPITAL_BASED_OUTPATIENT_CLINIC_OR_DEPARTMENT_OTHER): Payer: Self-pay | Admitting: Anesthesiology

## 2024-01-07 ENCOUNTER — Encounter (HOSPITAL_COMMUNITY): Payer: Self-pay | Admitting: Orthopedic Surgery

## 2024-01-07 ENCOUNTER — Ambulatory Visit (HOSPITAL_COMMUNITY)
Admission: RE | Admit: 2024-01-07 | Discharge: 2024-01-07 | Disposition: A | Discharge status: Home / Self Care | Payer: Medicaid Other | Attending: Orthopedic Surgery | Admitting: Orthopedic Surgery

## 2024-01-07 ENCOUNTER — Ambulatory Visit (HOSPITAL_COMMUNITY): Payer: Self-pay | Admitting: Physician Assistant

## 2024-01-07 DIAGNOSIS — S43432A Superior glenoid labrum lesion of left shoulder, initial encounter: Secondary | ICD-10-CM | POA: Diagnosis not present

## 2024-01-07 DIAGNOSIS — K219 Gastro-esophageal reflux disease without esophagitis: Secondary | ICD-10-CM | POA: Insufficient documentation

## 2024-01-07 DIAGNOSIS — X58XXXA Exposure to other specified factors, initial encounter: Secondary | ICD-10-CM | POA: Insufficient documentation

## 2024-01-07 DIAGNOSIS — M19012 Primary osteoarthritis, left shoulder: Secondary | ICD-10-CM

## 2024-01-07 DIAGNOSIS — Z01818 Encounter for other preprocedural examination: Secondary | ICD-10-CM

## 2024-01-07 DIAGNOSIS — G35 Multiple sclerosis: Secondary | ICD-10-CM | POA: Diagnosis not present

## 2024-01-07 DIAGNOSIS — M7552 Bursitis of left shoulder: Secondary | ICD-10-CM | POA: Diagnosis not present

## 2024-01-07 DIAGNOSIS — I10 Essential (primary) hypertension: Secondary | ICD-10-CM | POA: Insufficient documentation

## 2024-01-07 DIAGNOSIS — Z6841 Body Mass Index (BMI) 40.0 and over, adult: Secondary | ICD-10-CM | POA: Diagnosis not present

## 2024-01-07 DIAGNOSIS — Z87891 Personal history of nicotine dependence: Secondary | ICD-10-CM | POA: Diagnosis not present

## 2024-01-07 DIAGNOSIS — E66813 Obesity, class 3: Secondary | ICD-10-CM | POA: Diagnosis not present

## 2024-01-07 SURGERY — SHOULDER ARTHROSCOPY WITH DISTAL CLAVICLE RESECTION
Anesthesia: Regional | Site: Shoulder | Laterality: Left

## 2024-01-07 MED ORDER — VANCOMYCIN HCL 1000 MG IV SOLR
INTRAVENOUS | Status: AC
  Filled 2024-01-07: qty 20

## 2024-01-07 MED ORDER — EPINEPHRINE PF 1 MG/ML IJ SOLN
INTRAMUSCULAR | Status: DC | PRN
  Administered 2024-01-07: .125 mL

## 2024-01-07 MED ORDER — LIDOCAINE 2% (20 MG/ML) 5 ML SYRINGE
INTRAMUSCULAR | Status: DC | PRN
Start: 2024-01-07 — End: 2024-01-07
  Administered 2024-01-07: 80 mg via INTRAVENOUS

## 2024-01-07 MED ORDER — PROPOFOL 10 MG/ML IV BOLUS
INTRAVENOUS | Status: DC | PRN
  Administered 2024-01-07: 200 mg via INTRAVENOUS

## 2024-01-07 MED ORDER — ROCURONIUM BROMIDE 10 MG/ML (PF) SYRINGE
PREFILLED_SYRINGE | INTRAVENOUS | Status: DC | PRN
  Administered 2024-01-07: 50 mg via INTRAVENOUS
  Administered 2024-01-07: 10 mg via INTRAVENOUS
  Administered 2024-01-07: 5 mg via INTRAVENOUS

## 2024-01-07 MED ORDER — TRANEXAMIC ACID-NACL 1000-0.7 MG/100ML-% IV SOLN
1000.0000 mg | INTRAVENOUS | Status: AC
  Administered 2024-01-07: 1000 mg via INTRAVENOUS

## 2024-01-07 MED ORDER — ACETAMINOPHEN 500 MG PO TABS
1000.0000 mg | ORAL_TABLET | Freq: Once | ORAL | Status: AC
  Administered 2024-01-07: 1000 mg via ORAL
  Filled 2024-01-07: qty 2

## 2024-01-07 MED ORDER — ONDANSETRON HCL 4 MG/2ML IJ SOLN
INTRAMUSCULAR | Status: AC
  Filled 2024-01-07: qty 2

## 2024-01-07 MED ORDER — POVIDONE-IODINE 7.5 % EX SOLN: Freq: Once | CUTANEOUS | Status: DC

## 2024-01-07 MED ORDER — MIDAZOLAM HCL 2 MG/2ML IJ SOLN
INTRAMUSCULAR | Status: AC
  Filled 2024-01-07: qty 2

## 2024-01-07 MED ORDER — FENTANYL CITRATE (PF) 100 MCG/2ML IJ SOLN
INTRAMUSCULAR | Status: AC
  Administered 2024-01-07: 100 ug via INTRAVENOUS
  Filled 2024-01-07: qty 2

## 2024-01-07 MED ORDER — PROPOFOL 10 MG/ML IV BOLUS
INTRAVENOUS | Status: AC
  Filled 2024-01-07: qty 20

## 2024-01-07 MED ORDER — METHOCARBAMOL 500 MG PO TABS: 500.0000 mg | ORAL_TABLET | Freq: Three times a day (TID) | ORAL | 1 refills | Status: DC | PRN

## 2024-01-07 MED ORDER — CHLORHEXIDINE GLUCONATE 0.12 % MT SOLN
15.0000 mL | Freq: Once | OROMUCOSAL | Status: AC
  Administered 2024-01-07: 15 mL via OROMUCOSAL
  Filled 2024-01-07: qty 15

## 2024-01-07 MED ORDER — MIDAZOLAM HCL 2 MG/2ML IJ SOLN
INTRAMUSCULAR | Status: AC
  Administered 2024-01-07: 4 mg via INTRAVENOUS
  Filled 2024-01-07: qty 2

## 2024-01-07 MED ORDER — AMISULPRIDE (ANTIEMETIC) 5 MG/2ML IV SOLN: 10.0000 mg | Freq: Once | INTRAVENOUS | Status: DC | PRN

## 2024-01-07 MED ORDER — CEFAZOLIN SODIUM-DEXTROSE 3-4 GM/150ML-% IV SOLN
3.0000 g | INTRAVENOUS | Status: AC
  Administered 2024-01-07: 3 g via INTRAVENOUS
  Filled 2024-01-07: qty 150

## 2024-01-07 MED ORDER — DEXAMETHASONE SODIUM PHOSPHATE 10 MG/ML IJ SOLN
INTRAMUSCULAR | Status: AC
  Filled 2024-01-07: qty 1

## 2024-01-07 MED ORDER — EPINEPHRINE PF 1 MG/ML IJ SOLN
INTRAMUSCULAR | Status: AC
  Filled 2024-01-07: qty 3

## 2024-01-07 MED ORDER — MIDAZOLAM HCL 2 MG/2ML IJ SOLN: 4.0000 mg | Freq: Once | INTRAMUSCULAR | Status: AC

## 2024-01-07 MED ORDER — ORAL CARE MOUTH RINSE: 15.0000 mL | Freq: Once | OROMUCOSAL | Status: AC

## 2024-01-07 MED ORDER — DEXAMETHASONE SODIUM PHOSPHATE 10 MG/ML IJ SOLN
INTRAMUSCULAR | Status: DC | PRN
  Administered 2024-01-07: 5 mg via INTRAVENOUS

## 2024-01-07 MED ORDER — SODIUM CHLORIDE (PF) 0.9 % IJ SOLN
INTRAMUSCULAR | Status: DC | PRN
  Administered 2024-01-07: 15 mL

## 2024-01-07 MED ORDER — ROCURONIUM BROMIDE 10 MG/ML (PF) SYRINGE
PREFILLED_SYRINGE | INTRAVENOUS | Status: AC
  Filled 2024-01-07: qty 10

## 2024-01-07 MED ORDER — FENTANYL CITRATE (PF) 100 MCG/2ML IJ SOLN
INTRAMUSCULAR | Status: AC
  Filled 2024-01-07: qty 2

## 2024-01-07 MED ORDER — FENTANYL CITRATE (PF) 100 MCG/2ML IJ SOLN: 25.0000 ug | INTRAMUSCULAR | Status: DC | PRN

## 2024-01-07 MED ORDER — SODIUM CHLORIDE 0.9 % IR SOLN
Status: DC | PRN
  Administered 2024-01-07 (×3): 3000 mL

## 2024-01-07 MED ORDER — ALBUTEROL SULFATE HFA 108 (90 BASE) MCG/ACT IN AERS
INHALATION_SPRAY | RESPIRATORY_TRACT | Status: AC
  Filled 2024-01-07: qty 6.7

## 2024-01-07 MED ORDER — OXYCODONE HCL 5 MG/5ML PO SOLN: 5.0000 mg | Freq: Once | ORAL | Status: DC | PRN

## 2024-01-07 MED ORDER — FENTANYL CITRATE (PF) 250 MCG/5ML IJ SOLN
INTRAMUSCULAR | Status: AC
  Filled 2024-01-07: qty 5

## 2024-01-07 MED ORDER — SUGAMMADEX SODIUM 200 MG/2ML IV SOLN
INTRAVENOUS | Status: AC
  Filled 2024-01-07: qty 4

## 2024-01-07 MED ORDER — FENTANYL CITRATE (PF) 100 MCG/2ML IJ SOLN: 100.0000 ug | Freq: Once | INTRAMUSCULAR | Status: AC

## 2024-01-07 MED ORDER — SUGAMMADEX SODIUM 200 MG/2ML IV SOLN
INTRAVENOUS | Status: DC | PRN
  Administered 2024-01-07: 254 mg via INTRAVENOUS

## 2024-01-07 MED ORDER — FENTANYL CITRATE (PF) 250 MCG/5ML IJ SOLN
INTRAMUSCULAR | Status: DC | PRN
  Administered 2024-01-07 (×2): 50 ug via INTRAVENOUS

## 2024-01-07 MED ORDER — OXYCODONE HCL 5 MG PO TABS: 5.0000 mg | ORAL_TABLET | Freq: Once | ORAL | Status: DC | PRN

## 2024-01-07 MED ORDER — LIDOCAINE 2% (20 MG/ML) 5 ML SYRINGE
INTRAMUSCULAR | Status: AC
  Filled 2024-01-07: qty 5

## 2024-01-07 MED ORDER — ONDANSETRON HCL 4 MG/2ML IJ SOLN
INTRAMUSCULAR | Status: DC | PRN
  Administered 2024-01-07: 4 mg via INTRAVENOUS

## 2024-01-07 MED ORDER — OXYCODONE HCL 5 MG PO TABS: 5.0000 mg | ORAL_TABLET | ORAL | 0 refills | Status: DC | PRN

## 2024-01-07 MED ORDER — ALBUTEROL SULFATE HFA 108 (90 BASE) MCG/ACT IN AERS
INHALATION_SPRAY | RESPIRATORY_TRACT | Status: DC | PRN
  Administered 2024-01-07 (×2): 3 via RESPIRATORY_TRACT

## 2024-01-07 MED ORDER — POVIDONE-IODINE 10 % EX SWAB
2.0000 | Freq: Once | CUTANEOUS | Status: AC
  Administered 2024-01-07: 2 via TOPICAL

## 2024-01-07 MED ORDER — CELECOXIB 100 MG PO CAPS
100.0000 mg | ORAL_CAPSULE | Freq: Two times a day (BID) | ORAL | 0 refills | Status: DC
Start: 2024-01-07 — End: 2024-02-04

## 2024-01-07 MED ORDER — LACTATED RINGERS IV SOLN: INTRAVENOUS | Status: DC

## 2024-01-07 SURGICAL SUPPLY — 42 items
ALCOHOL 70% 16 OZ (MISCELLANEOUS) ×1 IMPLANT
BAG COUNTER SPONGE SURGICOUNT (BAG) ×1 IMPLANT
BLADE EXCALIBUR 4.0X13 (MISCELLANEOUS) ×1 IMPLANT
BLADE SURG 11 STRL SS (BLADE) ×1 IMPLANT
BURR OVAL 8 FLU 4.0X13 (MISCELLANEOUS) IMPLANT
COOLER ICEMAN CLASSIC (MISCELLANEOUS) IMPLANT
DRAPE IMP U-DRAPE 54X76 (DRAPES) ×1 IMPLANT
DRAPE INCISE IOBAN 66X45 STRL (DRAPES) ×1 IMPLANT
DRAPE STERI 35X30 U-POUCH (DRAPES) ×1 IMPLANT
DRAPE U-SHAPE 47X51 STRL (DRAPES) ×2 IMPLANT
DRSG AQUACEL AG ADV 3.5X 4 (GAUZE/BANDAGES/DRESSINGS) ×1 IMPLANT
DRSG TEGADERM 4X4.75 (GAUZE/BANDAGES/DRESSINGS) IMPLANT
DURAPREP 26ML APPLICATOR (WOUND CARE) ×1 IMPLANT
DW OUTFLOW CASSETTE/TUBE SET (MISCELLANEOUS) ×1 IMPLANT
ELECT REM PT RETURN 9FT ADLT (ELECTROSURGICAL) ×1 IMPLANT
ELECTRODE REM PT RTRN 9FT ADLT (ELECTROSURGICAL) ×1 IMPLANT
GAUZE SPONGE 4X4 12PLY STRL (GAUZE/BANDAGES/DRESSINGS) IMPLANT
GAUZE XEROFORM 1X8 LF (GAUZE/BANDAGES/DRESSINGS) ×1 IMPLANT
GLOVE BIOGEL M 6.5 STRL (GLOVE) ×1 IMPLANT
GOWN STRL REUS W/ TWL LRG LVL3 (GOWN DISPOSABLE) ×3 IMPLANT
KIT BASIN OR (CUSTOM PROCEDURE TRAY) ×1 IMPLANT
KIT TURNOVER KIT B (KITS) ×1 IMPLANT
MANIFOLD NEPTUNE II (INSTRUMENTS) ×1 IMPLANT
NDL HYPO 25X1 1.5 SAFETY (NEEDLE) ×1 IMPLANT
NDL SPNL 18GX3.5 QUINCKE PK (NEEDLE) ×1 IMPLANT
NEEDLE HYPO 25X1 1.5 SAFETY (NEEDLE) ×1 IMPLANT
NEEDLE SPNL 18GX3.5 QUINCKE PK (NEEDLE) ×1 IMPLANT
NS IRRIG 1000ML POUR BTL (IV SOLUTION) ×1 IMPLANT
PACK SHOULDER (CUSTOM PROCEDURE TRAY) ×1 IMPLANT
PAD ARMBOARD 7.5X6 YLW CONV (MISCELLANEOUS) ×2 IMPLANT
PAD COLD SHLDR WRAP-ON (PAD) IMPLANT
PORT APPOLLO RF 90DEGREE MULTI (SURGICAL WAND) ×1 IMPLANT
RESTRAINT HEAD UNIVERSAL NS (MISCELLANEOUS) ×1 IMPLANT
SLING ARM FOAM STRAP LRG (SOFTGOODS) IMPLANT
SLING ARM FOAM STRAP XLG (SOFTGOODS) IMPLANT
SPONGE T-LAP 4X18 ~~LOC~~+RFID (SPONGE) ×1 IMPLANT
SUCTION TUBE FRAZIER 10FR DISP (SUCTIONS) IMPLANT
SUT ETHILON 3 0 PS 1 (SUTURE) ×1 IMPLANT
TOWEL GREEN STERILE (TOWEL DISPOSABLE) ×1 IMPLANT
TOWEL GREEN STERILE FF (TOWEL DISPOSABLE) ×1 IMPLANT
TUBING ARTHROSCOPY IRRIG 16FT (MISCELLANEOUS) ×1 IMPLANT
WATER STERILE IRR 1000ML POUR (IV SOLUTION) ×1 IMPLANT

## 2024-01-07 NOTE — H&P (Signed)
 Devin Marsh is an 38 y.o. male.   Chief Complaint: Left shoulder pain HPI: Devin Marsh is a 38 y.o. male who presents  reporting left shoulder pain.  MRI of the left shoulder demonstrates AC joint arthritis and marrow edema in the distal clavicle.  Patient did have an ultrasound-guided injection into the subacromial space and AC joint on 04/2023 which gave him 75% relief for several months.  Pain has been ongoing for 2 years.  Patient has MS.  Has some occasional radicular pain but not below the elbow.  He is disabled.  Cannot sleep on the left-hand side.  He has had physical therapy as well..    Past Medical History:  Diagnosis Date   Abscess 04/13/2013   Arthritis    GERD (gastroesophageal reflux disease)    Headache    Hypertension    Multiple sclerosis (HCC)     Past Surgical History:  Procedure Laterality Date   APPENDECTOMY     INCISION AND DRAINAGE PERIRECTAL ABSCESS N/A 04/13/2013   Procedure: IRRIGATION AND DEBRIDEMENT PERIRECTAL ABSCESS;  Surgeon: Atilano Ina, MD;  Location: [facility] OR;  Service: General;  Laterality: N/A;   TOE SURGERY     WISDOM TOOTH EXTRACTION      Family History  Problem Relation Age of Onset   Diabetes Mother    Asthma Brother    Colon cancer Neg Hx    Esophageal cancer Neg Hx    Rectal cancer Neg Hx    Stomach cancer Neg Hx    Social History:  reports that he quit smoking about 6 years ago. His smoking use included cigarettes. He has never used smokeless tobacco. He reports that he does not drink alcohol and does not use drugs.  Allergies: No Known Allergies  Medications Prior to Admission  Medication Sig Dispense Refill   acetaminophen (TYLENOL) 500 MG tablet Take 500-1,000 mg by mouth every 6 (six) hours as needed (pain.).     gabapentin (NEURONTIN) 100 MG capsule Take 100 mg by mouth daily as needed (pain.).     Ofatumumab (KESIMPTA) 20 MG/0.4ML SOAJ Inject 20 mg into the skin every 30 (thirty) days.     propranolol ER (INDERAL LA) 80  MG 24 hr capsule Take 80 mg by mouth daily.     tadalafil (CIALIS) 5 MG tablet Take 5 mg by mouth daily as needed for erectile dysfunction.      No results found for this or any previous visit (from the past 48 hours). No results found.  Review of Systems  Musculoskeletal:  Positive for arthralgias.  All other systems reviewed and are negative.   There were no vitals taken for this visit. Physical Exam Vitals reviewed.  HENT:     Head: Normocephalic.     Nose: Nose normal.     Mouth/Throat:     Mouth: Mucous membranes are moist.  Eyes:     Pupils: Pupils are equal, round, and reactive to light.  Cardiovascular:     Rate and Rhythm: Normal rate.     Pulses: Normal pulses.  Pulmonary:     Effort: Pulmonary effort is normal.  Abdominal:     General: Abdomen is flat.  Musculoskeletal:     Cervical back: Normal range of motion.  Skin:    General: Skin is warm.     Capillary Refill: Capillary refill takes less than 2 seconds.  Neurological:     General: No focal deficit present.     Mental Status: He is alert.  Psychiatric:        Mood and Affect: Mood normal.   Ortho exam demonstrates tenderness to palpation in that [facility] joint on the left but less on the right. Rotator cuff strength is intact. Passive range of motion also intact with no loss asymmetrically on the left side versus right. No intra-articular coarseness or grinding with passive range of motion. Radial pulse intact bilaterally. O'Brien's testing is equivocal on the left negative on the right.  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  With this constellation of findings I think he has a very good chance of getting improvement arthroscopic distal clavicle excision.  Risk and benefits are discussed including not limited to infection nerve vessel damage incomplete pain weak as well as incomplete  restoration of function.  The expected duration of rehabilitation is also discussed.  All questions answered.   Burnard Bunting, MD 01/07/2024, 11:38 AM

## 2024-01-07 NOTE — Transfer of Care (Signed)
 Immediate Anesthesia Transfer of Care Note  Patient: Devin Marsh  Procedure(s) Performed: LEFT SHOULDER ARTHROSCOPY WITH DISTAL CLAVICLE EXCISION, BURSECTOMY (Left: Shoulder)  Patient Location: PACU  Anesthesia Type:GA combined with regional for post-op pain  Level of Consciousness: awake, drowsy, patient cooperative, and responds to stimulation  Airway & Oxygen Therapy: Patient Spontanous Breathing and Patient connected to face mask oxygen  Post-op Assessment: Report given to RN, Post -op Vital signs reviewed and stable, Patient moving all extremities, and Patient able to stick tongue midline  Post vital signs: Reviewed and stable  Last Vitals:  Vitals Value Taken Time  BP 142/92 01/07/24 1533  Temp 36.5 C 01/07/24 1533  Pulse 83 01/07/24 1536  Resp 21 01/07/24 1536  SpO2 100 % 01/07/24 1536  Vitals shown include unfiled device data.  Last Pain:  Vitals:   01/07/24 1533  TempSrc:   PainSc: (P) Asleep         Complications: No notable events documented.

## 2024-01-07 NOTE — Op Note (Signed)
 NAMELYCAN, DAVEE MEDICAL RECORD NO: [id number] ACCOUNT NO: [id number] DATE OF BIRTH: 1986-01-15 FACILITY: [facility] LOCATION: [facility]PER[facility] PHYSICIAN: Graylin Shiver. August Saucer, MD  Operative Report   DATE OF PROCEDURE: 01/07/2024  PREOPERATIVE DIAGNOSES:  Left shoulder AC joint arthritis and bursitis.  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  PROCEDURE:  Left shoulder arthroscopy with limited debridement of the anterior superior labrum with arthroscopic distal clavicle excision, bursectomy, and acromioplasty.  SURGEON:  Graylin Shiver. August Saucer, MD  ASSISTANT:  Karenann Cai.  INDICATIONS: This is a 38 year old patient with significant left shoulder pain localizing to the [facility] joint.  He presents for operative management after good, but temporary relief of his pain with AC joint injection.  DESCRIPTION OF PROCEDURE:  The patient was brought to the operating room where general endotracheal anesthesia was induced.  Preop antibiotics administered.  Timeout was called.  The patient was placed in the beach chair position with the head in neutral  position.  The left shoulder, arm and hand, prescrubbed with alcohol Betadine allowed to air dry prepped with DuraPrep solution and draped in a sterile manner.  Ioban used to seal the operative field and cover the axilla.  After calling timeout, the  shoulder was examined under anesthesia and found to have excellent range of motion of 70/100/170.  Good stability anterior and posterior.  After timeout was called, the posterior portal was created 2 cm medial and inferior to the posterolateral margin of  the acromion.  Diagnostic arthroscopy was performed.  The patient did have a degenerative anterosuperior SLAP tear, which was debrided using a shaver.  That was done after creating an anterior portal, which was in line with the anticipated resection  region of the distal clavicle.  Glenohumeral articular surface is intact.   Rotator cuff intact.  Anterior inferior, and posterior inferior glenohumeral ligaments intact.  Scope was then placed into the subacromial space.  Lateral portal was created.   Bursectomy was performed.  Acromioplasty was performed.  The distal 9 mm of the clavicle was then resected using a burr while maintaining the superior and posterior ligaments.  After resection, the shoulder was thoroughly irrigated.  Instruments were  removed and portals were closed using 3-0 Vicryl and 3-0 nylon with impervious dressings applied.  The patient tolerated the procedure well without immediate complications, placed in a sling.  He will follow up with [place] in one week.  Luke's assistance was  required for limb positioning, opening and closing.  His assistance was of medical necessity.   PUS D: 01/07/2024 3:48:57 pm T: 01/07/2024 5:06:00 pm  JOB: [id number]/ [id number]

## 2024-01-07 NOTE — Anesthesia Procedure Notes (Signed)
 Procedure Name: Intubation Date/Time: 01/07/2024 2:05 PM  Performed by: Berniece Pap, CRNAPre-anesthesia Checklist: Patient identified, Emergency Drugs available, Suction available and Patient being monitored Patient Re-evaluated:Patient Re-evaluated prior to induction Oxygen Delivery Method: Circle system utilized Preoxygenation: Pre-oxygenation with 100% oxygen Induction Type: IV induction Ventilation: Mask ventilation without difficulty Laryngoscope Size: Mac and 4 Grade View: Grade II Tube type: Oral Tube size: 7.5 mm Number of attempts: 1 Airway Equipment and Method: Stylet and Oral airway Placement Confirmation: ETT inserted through vocal cords under direct vision, positive ETCO2 and breath sounds checked- equal and bilateral Secured at: 22 cm Tube secured with: Tape Dental Injury: Teeth and Oropharynx as per pre-operative assessment

## 2024-01-07 NOTE — Brief Op Note (Signed)
   01/07/2024  3:45 PM  PATIENT:  Devin Marsh  38 y.o. male  PRE-OPERATIVE DIAGNOSIS:  left shoulder acromioclavicular osteoarthritis, bursitis  POST-OPERATIVE DIAGNOSIS:  left shoulder acromioclavicular osteoarthritis, bursitis  PROCEDURE:  Procedure(s): LEFT SHOULDER ARTHROSCOPY WITH DISTAL CLAVICLE EXCISION, BURSECTOMY  SURGEON:  Surgeon(s): August Saucer, Corrie Mckusick, MD  ASSISTANT: magnant pa  ANESTHESIA:   general  EBL: 5 ml    Total I/O In: 600 [I.V.:500; IV Piggyback:100] Out: 10 [Blood:10]  BLOOD ADMINISTERED: none  DRAINS: none   LOCAL MEDICATIONS USED:  none  SPECIMEN:  No Specimen  COUNTS:  YES  TOURNIQUET:  * No tourniquets in log *  DICTATION: .Other Dictation: Dictation Number [id number]  PLAN OF CARE: Discharge to home after PACU  PATIENT DISPOSITION:  PACU - hemodynamically stable

## 2024-01-08 ENCOUNTER — Encounter (HOSPITAL_COMMUNITY): Payer: Self-pay | Admitting: Emergency Medicine

## 2024-01-08 ENCOUNTER — Other Ambulatory Visit: Payer: Self-pay

## 2024-01-08 ENCOUNTER — Emergency Department (HOSPITAL_COMMUNITY)
Admission: EM | Admit: 2024-01-08 | Discharge: 2024-01-08 | Disposition: A | Discharge status: Home / Self Care | Attending: Emergency Medicine | Admitting: Emergency Medicine

## 2024-01-08 DIAGNOSIS — G35 Multiple sclerosis: Secondary | ICD-10-CM | POA: Insufficient documentation

## 2024-01-08 DIAGNOSIS — Z79899 Other long term (current) drug therapy: Secondary | ICD-10-CM | POA: Insufficient documentation

## 2024-01-08 DIAGNOSIS — T8189XA Other complications of procedures, not elsewhere classified, initial encounter: Secondary | ICD-10-CM | POA: Diagnosis present

## 2024-01-08 DIAGNOSIS — Z9889 Other specified postprocedural states: Secondary | ICD-10-CM

## 2024-01-08 DIAGNOSIS — R112 Nausea with vomiting, unspecified: Secondary | ICD-10-CM | POA: Insufficient documentation

## 2024-01-08 DIAGNOSIS — I10 Essential (primary) hypertension: Secondary | ICD-10-CM | POA: Diagnosis not present

## 2024-01-08 DIAGNOSIS — Y838 Other surgical procedures as the cause of abnormal reaction of the patient, or of later complication, without mention of misadventure at the time of the procedure: Secondary | ICD-10-CM | POA: Diagnosis not present

## 2024-01-08 LAB — LIPASE, BLOOD: Lipase: 26 U/L (ref 11–51)

## 2024-01-08 LAB — COMPREHENSIVE METABOLIC PANEL
ALT: 56 U/L — ABNORMAL HIGH (ref 0–44)
AST: 40 U/L (ref 15–41)
Albumin: 4.3 g/dL (ref 3.5–5.0)
Alkaline Phosphatase: 75 U/L (ref 38–126)
Anion gap: 10 (ref 5–15)
BUN: 10 mg/dL (ref 6–20)
CO2: 23 mmol/L (ref 22–32)
Calcium: 10.2 mg/dL (ref 8.9–10.3)
Chloride: 103 mmol/L (ref 98–111)
Creatinine, Ser: 1.26 mg/dL — ABNORMAL HIGH (ref 0.61–1.24)
GFR, Estimated: >60 mL/min (ref >60)
Glucose, Bld: 129 mg/dL — ABNORMAL HIGH (ref 70–99)
Potassium: 4 mmol/L (ref 3.5–5.1)
Sodium: 136 mmol/L (ref 135–145)
Total Bilirubin: 0.7 mg/dL (ref 0.0–1.2)
Total Protein: 7.9 g/dL (ref 6.5–8.1)

## 2024-01-08 LAB — CBC
HCT: 48 % (ref 39.0–52.0)
Hemoglobin: 15.9 g/dL (ref 13.0–17.0)
MCH: 28.9 pg (ref 26.0–34.0)
MCHC: 33.1 g/dL (ref 30.0–36.0)
MCV: 87.1 fL (ref 80.0–100.0)
Platelets: 320 10*3/uL (ref 150–400)
RBC: 5.51 MIL/uL (ref 4.22–5.81)
RDW: 13.6 % (ref 11.5–15.5)
WBC: 19.9 10*3/uL — ABNORMAL HIGH (ref 4.0–10.5)
nRBC: 0 % (ref 0.0–0.2)

## 2024-01-08 LAB — URINALYSIS, ROUTINE W REFLEX MICROSCOPIC
Bilirubin Urine: NEGATIVE
Glucose, UA: NEGATIVE mg/dL
Hgb urine dipstick: NEGATIVE
Ketones, ur: NEGATIVE mg/dL
Leukocytes,Ua: NEGATIVE
Nitrite: NEGATIVE
Protein, ur: NEGATIVE mg/dL
Specific Gravity, Urine: 1.009 (ref 1.005–1.030)
pH: 6 (ref 5.0–8.0)

## 2024-01-08 MED ORDER — LIDOCAINE VISCOUS HCL 2 % MT SOLN
15.0000 mL | Freq: Once | OROMUCOSAL | Status: AC
  Administered 2024-01-08: 15 mL via ORAL
  Filled 2024-01-08: qty 15

## 2024-01-08 MED ORDER — ALUM & MAG HYDROXIDE-SIMETH 200-200-20 MG/5ML PO SUSP
30.0000 mL | Freq: Once | ORAL | Status: AC
  Administered 2024-01-08: 30 mL via ORAL
  Filled 2024-01-08: qty 30

## 2024-01-08 MED ORDER — LACTATED RINGERS IV BOLUS
1000.0000 mL | Freq: Once | INTRAVENOUS | Status: AC
  Administered 2024-01-08: 1000 mL via INTRAVENOUS

## 2024-01-08 MED ORDER — ONDANSETRON HCL 4 MG/2ML IJ SOLN
4.0000 mg | Freq: Once | INTRAMUSCULAR | Status: AC
  Administered 2024-01-08: 4 mg via INTRAVENOUS
  Filled 2024-01-08: qty 2

## 2024-01-08 MED ORDER — PROMETHAZINE HCL 25 MG RE SUPP: 25.0000 mg | Freq: Four times a day (QID) | RECTAL | 0 refills | Status: DC | PRN

## 2024-01-08 MED ORDER — PANTOPRAZOLE SODIUM 40 MG IV SOLR
40.0000 mg | Freq: Once | INTRAVENOUS | Status: AC
  Administered 2024-01-08: 40 mg via INTRAVENOUS
  Filled 2024-01-08: qty 10

## 2024-01-08 MED ORDER — METOCLOPRAMIDE HCL 5 MG/ML IJ SOLN
10.0000 mg | INTRAMUSCULAR | Status: AC
  Administered 2024-01-08: 10 mg via INTRAVENOUS
  Filled 2024-01-08: qty 2

## 2024-01-08 MED ORDER — FAMOTIDINE IN NACL 20-0.9 MG/50ML-% IV SOLN
20.0000 mg | Freq: Once | INTRAVENOUS | Status: AC
  Administered 2024-01-08: 20 mg via INTRAVENOUS
  Filled 2024-01-08: qty 50

## 2024-01-08 NOTE — ED Provider Notes (Signed)
 [facility] AT [facility] Provider Note   CSN: [id number] Arrival date & time: 01/08/24  0536     History  Chief Complaint  Patient presents with   Emesis    Devin Marsh is a 38 y.o. male.  38 y/o male with hx of HTN, GERD, and MS (on Kesimpta) presents to the ED for evaluation of N/V. Reports having arthroscopy of his L shoulder by Dr. August Saucer yesterday. Since coming out of anesthesia, has had ongoing nausea with vomiting; inability to tolerate PO. Last PO intake was 48 hours ago, prior to surgery. Some emesis has been streaked with BRB. Has tried Zofran w/o relief. Notes some mild discomfort "in my ribs" when breathing, but this has subsided. No abdominal pain. He has been passing flatus and had a BM postoperatively.    Emesis      Home Medications Prior to Admission medications   Medication Sig Start Date End Date Taking? Authorizing Provider  promethazine (PHENERGAN) 25 MG suppository Place 1 suppository (25 mg total) rectally every 6 (six) hours as needed for nausea or vomiting. 01/08/24  Yes Antony Madura, PA-C  acetaminophen (TYLENOL) 500 MG tablet Take 500-1,000 mg by mouth every 6 (six) hours as needed (pain.).    [provider]  celecoxib (CELEBREX) 100 MG capsule Take 1 capsule (100 mg total) by mouth 2 (two) times daily. 01/07/24 01/06/25  Magnant, Charles L, PA-C  gabapentin (NEURONTIN) 100 MG capsule Take 100 mg by mouth daily as needed (pain.).    [provider]  methocarbamol (ROBAXIN) 500 MG tablet Take 1 tablet (500 mg total) by mouth every 8 (eight) hours as needed for muscle spasms. 01/07/24   Magnant, Charles L, PA-C  Ofatumumab (KESIMPTA) 20 MG/0.4ML SOAJ Inject 20 mg into the skin every 30 (thirty) days.    [provider]  oxyCODONE (ROXICODONE) 5 MG immediate release tablet Take 1 tablet (5 mg total) by mouth every 4 (four) hours as needed for severe pain (pain score 7-10). 01/07/24   Magnant, Charles L,  PA-C  propranolol ER (INDERAL LA) 80 MG 24 hr capsule Take 80 mg by mouth daily.    [provider]  tadalafil (CIALIS) 5 MG tablet Take 5 mg by mouth daily as needed for erectile dysfunction.    [provider]      Allergies    Patient has no known allergies.    Review of Systems   Review of Systems  Gastrointestinal:  Positive for vomiting.  Ten systems reviewed and are negative for acute change, except as noted in the HPI.    Physical Exam Updated Vital Signs BP (!) 145/91 (BP Location: Right Arm)   Pulse 99   Temp 99.3 F (37.4 C) (Oral)   Resp 20   Ht 5\' 9"  (1.753 m)   Wt 127 kg   SpO2 100%   BMI 41.35 kg/m   Physical Exam Vitals and nursing note reviewed.  Constitutional:      General: He is not in acute distress.    Appearance: He is well-developed. He is not diaphoretic.     Comments: Nontoxic appearing and in NAD  HENT:     Head: Normocephalic and atraumatic.  Eyes:     General: No scleral icterus.    Conjunctiva/sclera: Conjunctivae normal.  Pulmonary:     Effort: Pulmonary effort is normal. No respiratory distress.     Comments: Respirations even and unlabored Musculoskeletal:        General:  Normal range of motion.     Cervical back: Normal range of motion.     Comments: Bandages over postoperative incision sites appear C/D/I. Utilizing LUE sling.  Skin:    General: Skin is warm and dry.     Coloration: Skin is not pale.     Findings: No erythema or rash.  Neurological:     Mental Status: He is alert and oriented to person, place, and time.     Coordination: Coordination normal.     Comments: Ambulatory with steady gait.  Psychiatric:        Behavior: Behavior normal.     ED Results / Procedures / Treatments   Labs (all labs ordered are listed, but only abnormal results are displayed) Labs Reviewed  COMPREHENSIVE METABOLIC PANEL - Abnormal; Notable for the following components:      Result Value   Glucose, Bld 129 (*)     Creatinine, Ser 1.26 (*)    ALT 56 (*)    All other components within normal limits  CBC - Abnormal; Notable for the following components:   WBC 19.9 (*)    All other components within normal limits  URINALYSIS, ROUTINE W REFLEX MICROSCOPIC - Abnormal; Notable for the following components:   Color, Urine STRAW (*)    All other components within normal limits  LIPASE, BLOOD    EKG EKG Interpretation Date/Time:  Wednesday January 08 2024 05:54:43 EDT Ventricular Rate:  103 PR Interval:  168 QRS Duration:  82 QT Interval:  330 QTC Calculation: 432 R Axis:   56  Text Interpretation: Sinus tachycardia Otherwise normal ECG When compared with ECG of 01-Jan-2024 10:46, PREVIOUS ECG IS PRESENT Since last tracing rate faster Confirmed by Jacalyn Lefevre [phone]) on 01/08/2024 7:55:20 AM  Radiology No results found.  Procedures Procedures    Medications Ordered in ED Medications  lactated ringers bolus 1,000 mL (0 mLs Intravenous Stopped 01/08/24 0954)  metoCLOPramide (REGLAN) injection 10 mg (10 mg Intravenous Given 01/08/24 0846)  pantoprazole (PROTONIX) injection 40 mg (40 mg Intravenous Given 01/08/24 0848)  famotidine (PEPCID) IVPB 20 mg premix (0 mg Intravenous Stopped 01/08/24 0923)  alum & mag hydroxide-simeth (MAALOX/MYLANTA) 200-200-20 MG/5ML suspension 30 mL (30 mLs Oral Given 01/08/24 1150)    And  lidocaine (XYLOCAINE) 2 % viscous mouth solution 15 mL (15 mLs Oral Given 01/08/24 1149)  ondansetron (ZOFRAN) injection 4 mg (4 mg Intravenous Given 01/08/24 1150)    ED Course/ Medical Decision Making/ A&P Clinical Course as of 01/08/24 1400  Wed Jan 08, 2024  0743 EGD Findings 09/02/23: Distal esophagus, LA Grade A esophagitis was present. No apparent esophageal stricture. There was mild, erosive gastritis in the gastric antrum. Normal duodenal mucosa  [KH]  0758 Mild AKI likely 2/2 dehydration given lack of PO intake in 48 hours. Will hydrate. Does have leukocytosis of 19.9, though  this is favored to be a postoperative change rather than representative of acute infection. [KH]  0981 Feeling better. No emesis since medications. Open to PO trial.  [KH]  1212 Tolerating PO fluids. [KH]  1317 Tolerating PB and crackers as well as PO fluids. Feeling better. Will d/c with Rx for phenergan supp should ODT Zofran not provide relief if symptoms recur.  [KH]    Clinical Course User Index [KH] Antony Madura, PA-C  Medical Decision Making Amount and/or Complexity of Data Reviewed Labs: ordered.  Risk OTC drugs. Prescription drug management.   This patient presents to the ED for concern of N/V, this involves an extensive number of treatment options, and is a complaint that carries with it a high risk of complications and morbidity.  The differential diagnosis includes viral illness vs anesthesia reaction vs pSBO/SBO vs ileus vs PUD/gastritis.   Co morbidities that complicate the patient evaluation  HTN GERD MS   Additional history obtained:  Additional history obtained from partner, at bedside External records from outside source obtained and reviewed including EGD from November 2024.   Lab Tests:  I Ordered, and personally interpreted labs.  The pertinent results include:  WBC 19.9 (favored to be postsurgical), Creatinine 1.26   Cardiac Monitoring:  The patient was maintained on a cardiac monitor.  I personally viewed and interpreted the cardiac monitored which showed an underlying rhythm of: sinus tachycardia > NSR   Medicines ordered and prescription drug management:  I ordered medication including Protonix, Pepcid, Reglan, and Zofran for N/V.  Reevaluation of the patient after these medicines showed that the patient improved I have reviewed the patients home medicines and have made adjustments as needed   Test Considered:  Xray abdomen - clinically w/o signs of pSBO/SBO. Had postoperative BM, no abdominal pain, vomiting  cessation with medications   Problem List / ED Course:  As above   Reevaluation:  After the interventions noted above, I reevaluated the patient and found that they have :improved   Social Determinants of Health:  Good social support; partner at bedside   Dispostion:  After consideration of the diagnostic results and the patients response to treatment, I feel that the patent would benefit from outpatient symptom control with antiemetics. Encouraged PO fluids, clear liquids. Return precautions discussed and provided. Patient discharged in stable condition with no unaddressed concerns.          Final Clinical Impression(s) / ED Diagnoses Final diagnoses:  Postoperative nausea and vomiting    Rx / DC Orders ED Discharge Orders          Ordered    promethazine (PHENERGAN) 25 MG suppository  Every 6 hours PRN        01/08/24 1318              Antony Madura, PA-C 01/08/24 1413    Jacalyn Lefevre, MD 01/08/24 1554

## 2024-01-08 NOTE — ED Notes (Signed)
Pt given fluids and tolerated well.  

## 2024-01-08 NOTE — ED Triage Notes (Addendum)
 Patient reports nausea and vomiting x1 day and epigastric pain. Patient reports having surgery today on left shoulder under general anesthesia. Patient reports taking Zofran without relief. Patient concerned about keeping water and pain medication down.

## 2024-01-08 NOTE — Discharge Instructions (Signed)
 Avoid fried foods, fatty foods, greasy foods, and milk products until symptoms resolve. Drink plenty of clear liquids to prevent dehydration. We recommend the use of Zofran and/or Phenergan for nausea/vomiting. Follow-up with your primary care doctor to ensure resolution of symptoms.

## 2024-01-09 ENCOUNTER — Encounter (INDEPENDENT_AMBULATORY_CARE_PROVIDER_SITE_OTHER): Payer: Self-pay

## 2024-01-09 ENCOUNTER — Ambulatory Visit (INDEPENDENT_AMBULATORY_CARE_PROVIDER_SITE_OTHER): Payer: Medicaid Other | Admitting: Primary Care

## 2024-01-09 MED ORDER — BUPIVACAINE LIPOSOME 1.3 % IJ SUSP
INTRAMUSCULAR | Status: DC | PRN
  Administered 2024-01-07: 10 mL via PERINEURAL

## 2024-01-09 MED ORDER — BUPIVACAINE HCL (PF) 0.5 % IJ SOLN
INTRAMUSCULAR | Status: DC | PRN
  Administered 2024-01-07: 15 mL via PERINEURAL

## 2024-01-09 NOTE — Anesthesia Postprocedure Evaluation (Signed)
 Anesthesia Post Note  Patient: Devin Marsh  Procedure(s) Performed: LEFT SHOULDER ARTHROSCOPY WITH DISTAL CLAVICLE EXCISION, BURSECTOMY (Left: Shoulder)     Patient location during evaluation: PACU Anesthesia Type: Regional and General Level of consciousness: awake and alert Pain management: pain level controlled Vital Signs Assessment: post-procedure vital signs reviewed and stable Respiratory status: spontaneous breathing, nonlabored ventilation, respiratory function stable and patient connected to nasal cannula oxygen Cardiovascular status: blood pressure returned to baseline and stable Postop Assessment: no apparent nausea or vomiting Anesthetic complications: no  No notable events documented.  Last Vitals:  Vitals:   01/07/24 1545 01/07/24 1600  BP: (!) 143/98 (!) 151/95  Pulse: 76 64  Resp: 19 17  Temp:  36.5 C  SpO2: 100% 98%    Last Pain:  Vitals:   01/07/24 1545  TempSrc:   PainSc: 0-No pain                 Kacey Dysert L Babyboy Loya

## 2024-01-09 NOTE — Anesthesia Procedure Notes (Signed)
 Anesthesia Regional Block: Interscalene brachial plexus block   Pre-Anesthetic Checklist: , timeout performed,  Correct Patient, Correct Site, Correct Laterality,  Correct Procedure, Correct Position, site marked,  Risks and benefits discussed,  Pre-op evaluation,  At surgeon's request and post-op pain management  Laterality: Left  Prep: Maximum Sterile Barrier Precautions used, chloraprep       Needles:  Injection technique: Single-shot  Needle Type: Echogenic Stimulator Needle     Needle Length: 5cm  Needle Gauge: 21     Additional Needles:   Procedures:,,,, ultrasound used (permanent image in chart),,    Narrative:  Start time: 01/07/2024 12:33 PM End time: 01/07/2024 12:38 PM Injection made incrementally with aspirations every 5 mL. Anesthesiologist: Elmer Picker, MD

## 2024-01-15 ENCOUNTER — Ambulatory Visit: Payer: Medicaid Other | Admitting: Surgical

## 2024-01-15 ENCOUNTER — Encounter: Payer: Self-pay | Admitting: Surgical

## 2024-01-15 DIAGNOSIS — M25512 Pain in left shoulder: Secondary | ICD-10-CM

## 2024-01-15 NOTE — Progress Notes (Signed)
 Post-Op Visit Note   Patient: Devin Marsh           Date of Birth: 09-21-1986           MRN: [id number] Visit Date: 01/15/2024 PCP: Grayce Sessions, NP   Assessment & Plan:  Chief Complaint:  Chief Complaint  Patient presents with   Left Shoulder - Routine Post Op   Visit Diagnoses:  1. Arthralgia of left acromioclavicular joint     Plan: Patient is a 38 year old male who presents s/p left shoulder arthroscopy with distal clavicle excision on 01/07/2024.  He states that he is having a lot of pain.  Has occasional fevers after surgery that happens about once a day or once every other day.  He feels pain is pretty intense and rates pain 9.5/10.  He is using ice which helps with the pain and occasional heat he gets in his shoulder.  He did have to be seen at the hospital the day after surgery to get fluids.  He has only been taking methocarbamol for pain control.  Not using sling.  No chest pain or shortness of breath.  Intact EPL, FPL, finger abduction, grip strength testing, pronation, supination, bicep, tricep, deltoid.  Axillary nerve intact with deltoid firing.  Incisions look to be healing well with sutures intact.  Sutures removed and replaced with Steri-Strips today.  No evidence of infection or dehiscence of the incisions.  2+ radial pulse of the operative extremity.  No significant asymmetric warmth of the operative shoulder compared with the contralateral side.  Impression is increased postop pain from left shoulder distal clavicle excision.  He is not really taking any significant pain medication for pain control and with really no sign of infection on exam today, does not seem related to infectious pathology.  He has no consistent fevers or chills just intermittent fever about once a day or once every other day.  I would recommend taking Tylenol when he has fevers and seeing if this controls his fevers and taking oxycodone for pain control as prescribed to see if he can  keep on top of the pain.  Continue with range of motion exercises and follow-up in 3 weeks for clinical recheck.  If he has any concerns in the meantime or if these fevers persist/worsen, he will reach out to the office sooner.  Follow-Up Instructions: No follow-ups on file.   Orders:  No orders of the defined types were placed in this encounter.  No orders of the defined types were placed in this encounter.   Imaging: No results found.  PMFS History: Patient Active Problem List   Diagnosis Date Noted   Multiple sclerosis (HCC) 09/11/2023   Rectal bleeding 06/15/2020   Anal itching 06/15/2020   Non-traumatic rhabdomyolysis    Rhabdomyolysis 05/11/2015   Tobacco abuse 05/11/2015   Past Medical History:  Diagnosis Date   Abscess 04/13/2013   Arthritis    GERD (gastroesophageal reflux disease)    Headache    Hypertension    Multiple sclerosis (HCC)     Family History  Problem Relation Age of Onset   Diabetes Mother    Asthma Brother    Colon cancer Neg Hx    Esophageal cancer Neg Hx    Rectal cancer Neg Hx    Stomach cancer Neg Hx     Past Surgical History:  Procedure Laterality Date   APPENDECTOMY     INCISION AND DRAINAGE PERIRECTAL ABSCESS N/A 04/13/2013   Procedure: IRRIGATION AND DEBRIDEMENT  PERIRECTAL ABSCESS;  Surgeon: Atilano Ina, MD;  Location: [facility] OR;  Service: General;  Laterality: N/A;   SHOULDER ARTHROSCOPY WITH DISTAL CLAVICLE RESECTION Left 01/07/2024   Procedure: LEFT SHOULDER ARTHROSCOPY WITH DISTAL CLAVICLE EXCISION, BURSECTOMY;  Surgeon: Cammy Copa, MD;  Location: [facility] OR;  Service: Orthopedics;  Laterality: Left;   TOE SURGERY     WISDOM TOOTH EXTRACTION     Social History   Occupational History   Not on file  Tobacco Use   Smoking status: Former    Current packs/day: 0.00    Types: Cigarettes    Quit date: 10/29/2017    Years since quitting: 6.2   Smokeless tobacco: Never  Vaping Use   Vaping status: Some Days  Substance and  Sexual Activity   Alcohol use: No   Drug use: No   Sexual activity: Yes

## 2024-01-19 DIAGNOSIS — M19012 Primary osteoarthritis, left shoulder: Secondary | ICD-10-CM

## 2024-01-19 DIAGNOSIS — M7552 Bursitis of left shoulder: Secondary | ICD-10-CM

## 2024-01-20 ENCOUNTER — Telehealth (INDEPENDENT_AMBULATORY_CARE_PROVIDER_SITE_OTHER): Payer: Self-pay | Admitting: Primary Care

## 2024-01-20 NOTE — Telephone Encounter (Signed)
 Spoke to pt about appt.. Will be present

## 2024-01-28 ENCOUNTER — Ambulatory Visit (INDEPENDENT_AMBULATORY_CARE_PROVIDER_SITE_OTHER): Admitting: Primary Care

## 2024-01-28 ENCOUNTER — Encounter (INDEPENDENT_AMBULATORY_CARE_PROVIDER_SITE_OTHER): Payer: Self-pay | Admitting: Primary Care

## 2024-01-28 VITALS — BP 137/95 | HR 71 | Ht 69.0 in | Wt 271.4 lb

## 2024-01-28 DIAGNOSIS — Z13220 Encounter for screening for lipoid disorders: Secondary | ICD-10-CM

## 2024-01-28 DIAGNOSIS — I861 Scrotal varices: Secondary | ICD-10-CM

## 2024-01-28 DIAGNOSIS — I10 Essential (primary) hypertension: Secondary | ICD-10-CM

## 2024-01-28 DIAGNOSIS — G35 Multiple sclerosis: Secondary | ICD-10-CM

## 2024-01-28 DIAGNOSIS — R7303 Prediabetes: Secondary | ICD-10-CM

## 2024-01-28 NOTE — Progress Notes (Signed)
 [facility]  Devin Marsh, is a 38 y.o. male  HYQ:[id number]  XBM:[id number]  DOB - April 15, 1986  Chief Complaint  Patient presents with   Medical Management of Chronic Issues    Patient need a referral to urology        Subjective:   Devin Marsh is a 38 y.o. morbid obese male here today for an acute visit. He is concerned with scrotum pain bilateral previously sent to urology for varicoceles due to insurance was no longer able to keep appointments.  Will refer back.  Blood pressure is elevated but managed by neurology [facility] Dr. Ephriam Knuckles Pocok for multiple sclerosis.  We discussed weight management types of foods to decrease intake, reading labels and exercising.    No problems updated.  Comprehensive ROS Pertinent positive and negative noted in HPI   No Known Allergies  Past Medical History:  Diagnosis Date   Abscess 04/13/2013   Arthritis    GERD (gastroesophageal reflux disease)    Headache    Hypertension    Multiple sclerosis (HCC)     Current Outpatient Medications on File Prior to Visit  Medication Sig Dispense Refill   acetaminophen (TYLENOL) 500 MG tablet Take 500-1,000 mg by mouth every 6 (six) hours as needed (pain.).     Ofatumumab (KESIMPTA) 20 MG/0.4ML SOAJ Inject 20 mg into the skin every 30 (thirty) days.     oxyCODONE (ROXICODONE) 5 MG immediate release tablet Take 1 tablet (5 mg total) by mouth every 4 (four) hours as needed for severe pain (pain score 7-10). 30 tablet 0   tadalafil (CIALIS) 5 MG tablet Take 5 mg by mouth daily as needed for erectile dysfunction.     celecoxib (CELEBREX) 100 MG capsule Take 1 capsule (100 mg total) by mouth 2 (two) times daily. 60 capsule 0   gabapentin (NEURONTIN) 100 MG capsule Take 100 mg by mouth daily as needed (pain.).     methocarbamol (ROBAXIN) 500 MG tablet Take 1 tablet (500 mg total) by mouth every 8 (eight) hours as needed for muscle spasms. 30 tablet 1   promethazine  (PHENERGAN) 25 MG suppository Place 1 suppository (25 mg total) rectally every 6 (six) hours as needed for nausea or vomiting. 12 each 0   propranolol ER (INDERAL LA) 80 MG 24 hr capsule Take 80 mg by mouth daily.     No current facility-administered medications on file prior to visit.   Health Maintenance  Topic Date Due   Medicare Annual Wellness Visit  Never done   COVID-19 Vaccine (1 - 2024-25 season) Never done   Flu Shot  05/29/2024   DTaP/Tdap/Td vaccine (2 - Td or Tdap) 05/30/2030   Hepatitis C Screening  Completed   HIV Screening  Completed   HPV Vaccine  Aged Out    Objective:   Vitals:   01/28/24 0838  BP: (!) 137/95  Pulse: 71  SpO2: 100%  Weight: 271 lb 6.4 oz (123.1 kg)  Height: 5\' 9"  (1.753 m)   BP Readings from Last 3 Encounters:  01/28/24 (!) 137/95  01/08/24 (!) 145/91  01/07/24 (!) 151/95      Physical Exam Vitals reviewed.  Constitutional:      Appearance: He is obese.     Comments: morbid  HENT:     Head: Normocephalic.     Right Ear: Tympanic membrane and external ear normal.     Left Ear: Tympanic membrane and external ear normal.     Nose: Nose  normal.  Eyes:     Extraocular Movements: Extraocular movements intact.     Pupils: Pupils are equal, round, and reactive to light.  Cardiovascular:     Rate and Rhythm: Normal rate and regular rhythm.  Pulmonary:     Effort: Pulmonary effort is normal.     Breath sounds: Normal breath sounds.  Abdominal:     General: Bowel sounds are normal. There is distension.     Palpations: Abdomen is soft.  Musculoskeletal:        General: Normal range of motion.  Skin:    General: Skin is warm and dry.  Neurological:     Mental Status: He is oriented to person, place, and time.  Psychiatric:        Mood and Affect: Mood normal.        Behavior: Behavior normal.        Thought Content: Thought content normal.        Judgment: Judgment normal.     Assessment & Plan  Devin Marsh was seen today for  medical management of chronic issues.  Diagnoses and all orders for this visit:  Prediabetes - educated on lifestyle modifications, including but not limited to diet choices and adding exercise to daily routine.   -     CBC with Differential -     Hemoglobin A1c  Multiple sclerosis (HCC) Managed by neurology  Morbid obesity (HCC) Discussed diet and exercise for person with BMI >25. Instructed: You must burn more calories than you eat. Losing 5 percent of your body weight should be considered a success. In the longer term, losing more than 15 percent of your body weight and staying at this weight is an extremely good result. However, keep in mind that even losing 5 percent of your body weight leads to important health benefits, so try not to get discouraged if you're not able to lose more than this. Will recheck weight in 3-6 months. -     Lipid Panel  Bilateral varicoceles -     Ambulatory referral to Urology  Lipid screening  Healthy lifestyle diet of fruits vegetables fish nuts whole grains and low saturated fat . Foods high in cholesterol or liver, fatty meats,cheese, butter avocados, nuts and seeds, chocolate and fried foods.  -     Lipid Panel  Essential hypertension BP goal - < 130/80 Explained that having normal blood pressure is the goal and medications are helping to get to goal and maintain normal blood pressure. DIET: Limit salt intake, read nutrition labels to check salt content, limit fried and high fatty foods  Avoid using multisymptom OTC cold preparations that generally contain sudafed which can rise BP. Consult with pharmacist on best cold relief products to use for persons with HTN EXERCISE Discussed incorporating exercise such as walking - 30 minutes most days of the week and can do in 10 minute intervals    -     CMP14+EGFR    Patient have been counseled extensively about nutrition and exercise. Other issues discussed during this visit include: low cholesterol  diet, weight control and daily exercise, foot care, annual eye examinations at Ophthalmology, importance of adherence with medications and regular follow-up. We also discussed long term complications of uncontrolled diabetes and hypertension.   Return in about 3 months (around 04/28/2024) for Weight management.  The patient was given clear instructions to go to ER or return to medical center if symptoms don't improve, worsen or new problems develop. The patient verbalized understanding.  The patient was told to call to get lab results if they haven't heard anything in the next week.   This note has been created with Education officer, environmental. Any transcriptional errors are unintentional.   Grayce Sessions, NP 01/28/2024, 9:23 AM

## 2024-02-04 ENCOUNTER — Other Ambulatory Visit: Payer: Self-pay | Admitting: Surgical

## 2024-02-05 ENCOUNTER — Ambulatory Visit: Admitting: Surgical

## 2024-02-05 ENCOUNTER — Encounter: Payer: Self-pay | Admitting: Surgical

## 2024-02-05 DIAGNOSIS — G8929 Other chronic pain: Secondary | ICD-10-CM

## 2024-02-05 DIAGNOSIS — M25512 Pain in left shoulder: Secondary | ICD-10-CM

## 2024-02-05 DIAGNOSIS — Z9889 Other specified postprocedural states: Secondary | ICD-10-CM

## 2024-02-05 MED ORDER — OXYCODONE HCL 5 MG PO TABS: 5.0000 mg | ORAL_TABLET | Freq: Three times a day (TID) | ORAL | 0 refills | Status: DC | PRN

## 2024-02-05 NOTE — Progress Notes (Signed)
 Post-Op Visit Note   Patient: Devin Marsh           Date of Birth: 24-Sep-1986           MRN: [id number] Visit Date: 02/05/2024 PCP: Grayce Sessions, NP   Assessment & Plan:  Chief Complaint:  Chief Complaint  Patient presents with   Left Shoulder - Routine Post Op, Follow-up    01/07/2024 left shoulder arthroscopy, DCE   Visit Diagnoses:  1. S/P arthroscopy of left shoulder   2. Arthralgia of left acromioclavicular joint   3. Chronic left shoulder pain     Plan: Patient is a 38 year old male who presents s/p left shoulder arthroscopy with distal clavicle excision on 01/07/2024.  He reports that pain is still fairly intense at times.  Has fairly constant pain.  Icing at night.  Doing pendulum exercises during the day.  Using oxycodone with some relief.  Feels like it is improved compared with last visit.  Fevers have resolved.  On exam, patient has 20 degrees X rotation, 70 degrees abduction, 110 degrees forward elevation passively and actively.  Incisions are well-healed.  Axillary nerve intact with deltoid firing.  There is still some continued tenderness over the [place] joint as expected.  2+ radial pulse of the operative extremity.  Intact EPL, FPL, finger abduction.  Plan to start physical therapy upstairs primarily to focus on regaining full passive and active range of motion of the operative shoulder.  Refilled oxycodone 1 more time.  Follow-up in 4 weeks for clinical recheck with Dr. August Saucer.  Follow-Up Instructions: No follow-ups on file.   Orders:  No orders of the defined types were placed in this encounter.  Meds ordered this encounter  Medications   oxyCODONE (ROXICODONE) 5 MG immediate release tablet    Sig: Take 1 tablet (5 mg total) by mouth every 8 (eight) hours as needed for severe pain (pain score 7-10).    Dispense:  30 tablet    Refill:  0    Imaging: No results found.  PMFS History: Patient Active Problem List   Diagnosis Date Noted   Bursitis of  left shoulder 01/19/2024   Arthritis of left acromioclavicular joint 01/19/2024   Multiple sclerosis (HCC) 09/11/2023   Rectal bleeding 06/15/2020   Anal itching 06/15/2020   Non-traumatic rhabdomyolysis    Rhabdomyolysis 05/11/2015   Tobacco abuse 05/11/2015   Past Medical History:  Diagnosis Date   Abscess 04/13/2013   Arthritis    GERD (gastroesophageal reflux disease)    Headache    Hypertension    Multiple sclerosis (HCC)     Family History  Problem Relation Age of Onset   Diabetes Mother    Asthma Brother    Colon cancer Neg Hx    Esophageal cancer Neg Hx    Rectal cancer Neg Hx    Stomach cancer Neg Hx     Past Surgical History:  Procedure Laterality Date   APPENDECTOMY     INCISION AND DRAINAGE PERIRECTAL ABSCESS N/A 04/13/2013   Procedure: IRRIGATION AND DEBRIDEMENT PERIRECTAL ABSCESS;  Surgeon: Atilano Ina, MD;  Location: [facility] OR;  Service: General;  Laterality: N/A;   SHOULDER ARTHROSCOPY WITH DISTAL CLAVICLE RESECTION Left 01/07/2024   Procedure: LEFT SHOULDER ARTHROSCOPY WITH DISTAL CLAVICLE EXCISION, BURSECTOMY;  Surgeon: Cammy Copa, MD;  Location: [facility] OR;  Service: Orthopedics;  Laterality: Left;   TOE SURGERY     WISDOM TOOTH EXTRACTION     Social History   Occupational History  Not on file  Tobacco Use   Smoking status: Former    Current packs/day: 0.00    Types: Cigarettes    Quit date: 10/29/2017    Years since quitting: 6.2   Smokeless tobacco: Never  Vaping Use   Vaping status: Some Days  Substance and Sexual Activity   Alcohol use: No   Drug use: No   Sexual activity: Yes

## 2024-02-11 ENCOUNTER — Other Ambulatory Visit: Payer: Self-pay

## 2024-02-11 ENCOUNTER — Encounter: Payer: Self-pay | Admitting: Physical Therapy

## 2024-02-11 ENCOUNTER — Ambulatory Visit: Attending: Orthopedic Surgery | Admitting: Physical Therapy

## 2024-02-11 DIAGNOSIS — R600 Localized edema: Secondary | ICD-10-CM | POA: Diagnosis present

## 2024-02-11 DIAGNOSIS — M25512 Pain in left shoulder: Secondary | ICD-10-CM | POA: Insufficient documentation

## 2024-02-11 DIAGNOSIS — M6281 Muscle weakness (generalized): Secondary | ICD-10-CM | POA: Diagnosis present

## 2024-02-11 DIAGNOSIS — G8929 Other chronic pain: Secondary | ICD-10-CM | POA: Insufficient documentation

## 2024-02-11 NOTE — Therapy (Signed)
 OUTPATIENT PHYSICAL THERAPY SHOULDER EVALUATION   Patient Name: Devin Marsh MRN: [id number] DOB:[birth year], 38 y.o., male Today's Date: 02/11/2024   PT End of Session - 02/11/24 1256     Visit Number 1    Number of Visits --   1-2x/week   Date for PT Re-Evaluation 04/07/24    Authorization Type MCR - Quick dash    Progress Note Due on Visit 10    PT Start Time 1100    PT Stop Time 1139    PT Time Calculation (min) 39 min             Past Medical History:  Diagnosis Date   Abscess 04/13/2013   Arthritis    GERD (gastroesophageal reflux disease)    Headache    Hypertension    Multiple sclerosis (HCC)    Past Surgical History:  Procedure Laterality Date   APPENDECTOMY     INCISION AND DRAINAGE PERIRECTAL ABSCESS N/A 04/13/2013   Procedure: IRRIGATION AND DEBRIDEMENT PERIRECTAL ABSCESS;  Surgeon: Fran Imus, MD;  Location: [facility] OR;  Service: General;  Laterality: N/A;   SHOULDER ARTHROSCOPY WITH DISTAL CLAVICLE RESECTION Left 01/07/2024   Procedure: LEFT SHOULDER ARTHROSCOPY WITH DISTAL CLAVICLE EXCISION, BURSECTOMY;  Surgeon: Jasmine Mesi, MD;  Location: [facility] OR;  Service: Orthopedics;  Laterality: Left;   TOE SURGERY     WISDOM TOOTH EXTRACTION     Patient Active Problem List   Diagnosis Date Noted   Bursitis of left shoulder 01/19/2024   Arthritis of left acromioclavicular joint 01/19/2024   Multiple sclerosis (HCC) 09/11/2023   Rectal bleeding 06/15/2020   Anal itching 06/15/2020   Non-traumatic rhabdomyolysis    Rhabdomyolysis 05/11/2015   Tobacco abuse 05/11/2015    PCP: Marius Siemens, NP  REFERRING PROVIDER: Jasmine Mesi, MD  THERAPY DIAG:  Left shoulder pain, unspecified chronicity  Muscle weakness  REFERRING DIAG: Arthralgia of left acromioclavicular joint [M25.512], Chronic left shoulder pain [M25.512, G89.29]   Rationale for Evaluation and Treatment:  Rehabilitation  SUBJECTIVE:  PERTINENT PAST HISTORY:  MS (weakness)        PRECAUTIONS: Distal clavicle excision and debridement of anterior superior labrum 3/11  OP Date: 01/07/2024  2 weeks 01/21/2024  4 weeks 02/04/2024  6 weeks 02/18/2024  8 weeks 03/03/2024  10 weeks 03/17/2024  12 weeks 03/31/2024    WEIGHT BEARING RESTRICTIONS Yes see precuations  FALLS:  Has patient fallen in last 6 months? Yes, Number of falls: 1 fall in shower  MOI/History of condition:  Onset date: 3/11  SUBJECTIVE STATEMENT  Devin Marsh is a 38 y.o. male who presents to clinic with chief complaint of L shoulder pain following shoulder scope and DCE on 3/11.  Pt reports continued pain following surgery.  He was having neck and shoulder pain following surgery.  He feels his mobility is improving but is still limited.   Red flags:  denies   Pain:  Are you having pain? Yes Pain location: UT and L shoulder NPRS scale:  4/10 to 9/10 Aggravating factors: shoulder movement Relieving factors: rest, ice Pain description: sharp and aching Stage: Subacute 24 hour pattern: worse with activity   Occupation: NA  Assistive Device: NA  Hand Dominance: R  Patient Goals/Specific Activities: reduce pain and improve shoulder ROM   OBJECTIVE:   DIAGNOSTIC FINDINGS:   MRI pre surgery  IMPRESSION: 1. Moderate supraspinatus tendinopathy anteriorly. 2. Moderate degenerative spurring and subcortical marrow edema at the acromioclavicular joint. 3. Mild subacromial subdeltoid bursitis. 4. Mild  degenerative chondral thinning in the glenohumeral joint.  GENERAL OBSERVATION: Fwd head rounded shoulders     SENSATION: Light touch: Appears intact   PALPATION: TTP L UT and over incision site  UPPER EXTREMITY AROM:  ROM Right (Eval) Left (Eval)  Shoulder flexion 150 60*  Shoulder abduction 150 60*  Shoulder internal rotation    Shoulder external rotation    Functional IR n Unable to tolerate  Functional ER n Unable to tolerate  Shoulder extension    Elbow extension     Elbow flexion     (Blank rows = not tested, N = WNL, * = concordant pain with testing)  UPPER EXTREMITY MMT:  MMT Right (Eval) Left (Eval)  Shoulder flexion 3-* in available range n  Shoulder abduction (C5) 3-* in available range n  Shoulder ER 3* n  Shoulder IR 3* n  Middle trapezius    Lower trapezius    Shoulder extension    Grip strength    Cervical flexion (C1,C2)    Cervical S/B (C3)    Shoulder shrug (C4)    Elbow flexion (C6)    Elbow ext (C7)    Thumb ext (C8)    Finger abd (T1)    Grossly     (Blank rows = not tested, score listed is out of 5 possible points.  N = WNL, D = diminished, C = clear for gross weakness with myotome testing, * = concordant pain with testing)  UPPER EXTREMITY PROM:  PROM Right (Eval) Left (Eval)  Shoulder flexion 65*   Shoulder abduction    Shoulder internal rotation    Shoulder external rotation 20*   Functional IR    Functional ER    Shoulder extension    Elbow extension    Elbow flexion     (Blank rows = not tested, N = WNL, * = concordant pain with testing)  SPECIAL TESTS: NA  JOINT MOBILITY TESTING:  NA  PATIENT SURVEYS:  Quick Dash: 43 pts    TODAY'S TREATMENT:  Therapeutic Exercise: Creating, reviewing, and completing below HEP   PATIENT EDUCATION ([place]/[facility]):  POC, diagnosis, prognosis, HEP, and outcome measures.  Pt educated via explanation, demonstration, and handout (HEP).  Pt confirms understanding verbally.   HOME EXERCISE PROGRAM: Access Code: [email or web address] URL: [email or web address] Date: 02/11/2024 Prepared by: Lesleigh Rash  Exercises - Shoulder External Rotation Reactive Isometrics  - 1 x daily - 7 x weekly - 3 sets - 10 reps - Shoulder Internal Rotation Reactive Isometrics  - 1 x daily - 7 x weekly - 3 sets - 10 reps - Seated Shoulder Flexion Towel Slide at Table Top  - 3 x daily - 7 x weekly - 3 sets - 10 reps  Treatment priorities   Eval        ROM        Gentle shoulder  strength                                  ASSESSMENT:  CLINICAL IMPRESSION: Devin Marsh is a 38 y.o. male who presents to clinic with signs and sxs consistent with L shoulder pain following distal clavicle excision and labral debridement on 3/11.  He will benefit from skilled PT to improve his shoulder ROM, strength, and function to allow completion of daily tasks involving lifting and reaching.  OBJECTIVE IMPAIRMENTS: Pain, L shoulder ROM, L shoulder strength  ACTIVITY LIMITATIONS: reaching, lifting, driving  PERSONAL  FACTORS: See medical history and pertinent history   REHAB POTENTIAL: Good  CLINICAL DECISION MAKING: Evolving/moderate complexity  EVALUATION COMPLEXITY: Moderate   GOALS:   SHORT TERM GOALS: Target date: 03/10/2024  Devin Marsh will be >75% HEP compliant to improve carryover between sessions and facilitate independent management of condition  Evaluation: ongoing Goal status: INITIAL   LONG TERM GOALS: Target date: 04/07/2024   Devin Marsh will self report >/= 50% decrease in pain from evaluation to improve function in daily tasks  Evaluation/Baseline: 9/10 max pain Goal status: INITIAL   2.  Devin Marsh will demonstrate >130 degrees of active ROM in flexion to allow completion of activities involving reaching OH, not limited by pain  Evaluation/Baseline: 60 degrees w/ pain Goal status: INITIAL   3.  Devin Marsh will improve the following MMTs to >/= 4/5 to show improvement in strength:     Evaluation/Baseline:   UPPER EXTREMITY MMT:  MMT Right (Eval) Left (Eval)  Shoulder flexion 3-* in available range n  Shoulder abduction (C5) 3-* in available range n  Shoulder ER 3* n  Shoulder IR 3* n  Middle trapezius    Lower trapezius    Shoulder extension    Grip strength    Cervical flexion (C1,C2)    Cervical S/B (C3)    Shoulder shrug (C4)    Elbow flexion (C6)    Elbow ext (C7)    Thumb ext (C8)    Finger abd (T1)    Grossly     (Blank rows =  not tested, score listed is out of 5 possible points.  N = WNL, D = diminished, C = clear for gross weakness with myotome testing, * = concordant pain with testing)  Goal status: INITIAL   4.  Devin Marsh will show a >/= 20 pt improvement in his QUICK DASH score (MCID is 10% or ~5 pts) as a proxy for functional improvement   Evaluation/Baseline: 43 pts Goal status: INITIAL   PLAN: PT FREQUENCY: 1-2x/week  PT DURATION: 8 weeks  PLANNED INTERVENTIONS:  97164- PT Re-evaluation, 97110-Therapeutic exercises, 97530- Therapeutic activity, 97112- Neuromuscular re-education, 97535- Self Care, 16109- Manual therapy, [id number]- Gait training, [id number]- Aquatic Therapy, [id number]- Electrical stimulation (manual), [id number]- Vasopneumatic device, [id number]- Traction (mechanical), [id number]- Ionotophoresis 4mg /ml Dexamethasone, Taping, Dry Needling, Joint manipulation, and Spinal manipulation.   Devin Marsh PT, DPT 02/11/2024, 12:58 PM

## 2024-02-20 ENCOUNTER — Encounter: Payer: Self-pay | Admitting: Physical Therapy

## 2024-02-20 ENCOUNTER — Ambulatory Visit: Admitting: Physical Therapy

## 2024-02-20 DIAGNOSIS — M25512 Pain in left shoulder: Secondary | ICD-10-CM | POA: Diagnosis not present

## 2024-02-20 DIAGNOSIS — R600 Localized edema: Secondary | ICD-10-CM

## 2024-02-20 DIAGNOSIS — M6281 Muscle weakness (generalized): Secondary | ICD-10-CM

## 2024-02-20 NOTE — Therapy (Signed)
 OUTPATIENT PHYSICAL THERAPY DAILY NOTE   Patient Name: Jcion Buddenhagen MRN: [id number] DOB:[birth year], 38 y.o., male Today's Date: 02/20/2024   PT End of Session - 02/20/24 1145     Visit Number 2    Number of Visits --   1-2x/week   Date for PT Re-Evaluation 04/07/24    Authorization Type MCR - Quick dash    Progress Note Due on Visit 10    PT Start Time 1145    PT Stop Time 1226    PT Time Calculation (min) 41 min             Past Medical History:  Diagnosis Date   Abscess 04/13/2013   Arthritis    GERD (gastroesophageal reflux disease)    Headache    Hypertension    Multiple sclerosis (HCC)    Past Surgical History:  Procedure Laterality Date   APPENDECTOMY     INCISION AND DRAINAGE PERIRECTAL ABSCESS N/A 04/13/2013   Procedure: IRRIGATION AND DEBRIDEMENT PERIRECTAL ABSCESS;  Surgeon: Fran Imus, MD;  Location: [facility] OR;  Service: General;  Laterality: N/A;   SHOULDER ARTHROSCOPY WITH DISTAL CLAVICLE RESECTION Left 01/07/2024   Procedure: LEFT SHOULDER ARTHROSCOPY WITH DISTAL CLAVICLE EXCISION, BURSECTOMY;  Surgeon: Jasmine Mesi, MD;  Location: [facility] OR;  Service: Orthopedics;  Laterality: Left;   TOE SURGERY     WISDOM TOOTH EXTRACTION     Patient Active Problem List   Diagnosis Date Noted   Bursitis of left shoulder 01/19/2024   Arthritis of left acromioclavicular joint 01/19/2024   Multiple sclerosis (HCC) 09/11/2023   Rectal bleeding 06/15/2020   Anal itching 06/15/2020   Non-traumatic rhabdomyolysis    Rhabdomyolysis 05/11/2015   Tobacco abuse 05/11/2015    PCP: Marius Siemens, NP  REFERRING PROVIDER: Jasmine Mesi, MD  THERAPY DIAG:  Left shoulder pain, unspecified chronicity - Plan: PT plan of care cert/re-cert  Muscle weakness - Plan: PT plan of care cert/re-cert  Localized edema - Plan: PT plan of care cert/re-cert  REFERRING DIAG: Arthralgia of left acromioclavicular joint [M25.512], Chronic left shoulder pain [M25.512,  G89.29]   Rationale for Evaluation and Treatment:  Rehabilitation  SUBJECTIVE:  PERTINENT PAST HISTORY:  MS (weakness)       PRECAUTIONS: Distal clavicle excision and debridement of anterior superior labrum 3/11  OP Date: 01/07/2024  2 weeks 01/21/2024  4 weeks 02/04/2024  6 weeks 02/18/2024  8 weeks 03/03/2024  10 weeks 03/17/2024  12 weeks 03/31/2024    WEIGHT BEARING RESTRICTIONS Yes see precuations  FALLS:  Has patient fallen in last 6 months? Yes, Number of falls: 1 fall in shower  MOI/History of condition:  Onset date: 3/11  SUBJECTIVE STATEMENT  02/20/2024: Pt reports that he has been completing his HEP.  He sees some progress.  EVAL: Lamonta Toomey is a 38 y.o. male who presents to clinic with chief complaint of L shoulder pain following shoulder scope and DCE on 3/11.  Pt reports continued pain following surgery.  He was having neck and shoulder pain following surgery.  He feels his mobility is improving but is still limited.   Red flags:  denies   Pain:  Are you having pain? Yes Pain location: UT and L shoulder NPRS scale:  4/10 to 9/10 Aggravating factors: shoulder movement Relieving factors: rest, ice Pain description: sharp and aching Stage: Subacute 24 hour pattern: worse with activity   Occupation: NA  Assistive Device: NA  Hand Dominance: R  Patient Goals/Specific Activities: reduce pain and  improve shoulder ROM   OBJECTIVE:   DIAGNOSTIC FINDINGS:   MRI pre surgery  IMPRESSION: 1. Moderate supraspinatus tendinopathy anteriorly. 2. Moderate degenerative spurring and subcortical marrow edema at the acromioclavicular joint. 3. Mild subacromial subdeltoid bursitis. 4. Mild degenerative chondral thinning in the glenohumeral joint.  GENERAL OBSERVATION: Fwd head rounded shoulders     SENSATION: Light touch: Appears intact   PALPATION: TTP L UT and over incision site  UPPER EXTREMITY AROM:  ROM Right (Eval) Left (Eval)  Shoulder  flexion 150 60*  Shoulder abduction 150 60*  Shoulder internal rotation    Shoulder external rotation    Functional IR n Unable to tolerate  Functional ER n Unable to tolerate  Shoulder extension    Elbow extension    Elbow flexion     (Blank rows = not tested, N = WNL, * = concordant pain with testing)  UPPER EXTREMITY MMT:  MMT Right (Eval) Left (Eval)  Shoulder flexion 3-* in available range n  Shoulder abduction (C5) 3-* in available range n  Shoulder ER 3* n  Shoulder IR 3* n  Middle trapezius    Lower trapezius    Shoulder extension    Grip strength    Cervical flexion (C1,C2)    Cervical S/B (C3)    Shoulder shrug (C4)    Elbow flexion (C6)    Elbow ext (C7)    Thumb ext (C8)    Finger abd (T1)    Grossly     (Blank rows = not tested, score listed is out of 5 possible points.  N = WNL, D = diminished, C = clear for gross weakness with myotome testing, * = concordant pain with testing)  UPPER EXTREMITY PROM:  PROM Right (Eval) Left (Eval)   Shoulder flexion 65*    Shoulder abduction     Shoulder internal rotation     Shoulder external rotation 20*    Functional IR     Functional ER     Shoulder extension     Elbow extension     Elbow flexion      (Blank rows = not tested, N = WNL, * = concordant pain with testing)  SPECIAL TESTS: NA  JOINT MOBILITY TESTING:  NA  PATIENT SURVEYS:  Quick Dash: 43 pts    TODAY'S TREATMENT:   [facility] Adult PT Treatment  02/20/2024:  Therapeutic Exercise: PROM flexion - stopped d/t pain Prone chest press with dowel - 2x10  Shoulder flexion ROM - relatively comfortable arc Pball roll up 45 degree table - 25x Towel slide at wall  Modalities:  Vasopneumatic (Game Ready)    Location:  left shoulder Time:  10 minutes Pressure:  medium Temperature:  40 degrees    HOME EXERCISE PROGRAM: Access Code: [email or web address] URL: [email or web address] Date: 02/20/2024 Prepared by: Lesleigh Rash  Exercises - Shoulder External Rotation Reactive Isometrics  - 1 x daily - 7 x weekly - 3 sets - 10 reps - Shoulder Internal Rotation Reactive Isometrics  - 1 x daily - 7 x weekly - 3 sets - 10 reps - Seated Shoulder Flexion Towel Slide at Table Top  - 3 x daily - 7 x weekly - 3 sets - 10 reps - Shoulder Flexion Wall Slide with Towel  - 2 x daily - 7 x weekly - 2-3 sets - 10 reps  Treatment priorities   Eval        ROM        Gentle shoulder strength  ASSESSMENT:  CLINICAL IMPRESSION:  02/20/2024:  Jodey tolerated session well with no adverse reaction.  Working mainly on increasing ROM which is coming along nicely.  Caswell is able to achieve >100 degree AAROM today.  He does better with AAROM vs PROM and we will continue with this.  Updated HEP.   EVAL:  Raequan is a 38 y.o. male who presents to clinic with signs and sxs consistent with L shoulder pain following distal clavicle excision and labral debridement on 3/11.  He will benefit from skilled PT to improve his shoulder ROM, strength, and function to allow completion of daily tasks involving lifting and reaching.  OBJECTIVE IMPAIRMENTS: Pain, L shoulder ROM, L shoulder strength  ACTIVITY LIMITATIONS: reaching, lifting, driving  PERSONAL FACTORS: See medical history and pertinent history   REHAB POTENTIAL: Good  CLINICAL DECISION MAKING: Evolving/moderate complexity  EVALUATION COMPLEXITY: Moderate   GOALS:   SHORT TERM GOALS: Target date: 03/10/2024  Amman will be >75% HEP compliant to improve carryover between sessions and facilitate independent management of condition  Evaluation: ongoing Goal status: INITIAL   LONG TERM GOALS: Target date: 04/07/2024   Noa will self report >/= 50% decrease in pain from evaluation to improve function in daily tasks  Evaluation/Baseline: 9/10 max pain Goal status: INITIAL   2.  Zula Hitch will demonstrate >130  degrees of active ROM in flexion to allow completion of activities involving reaching OH, not limited by pain  Evaluation/Baseline: 60 degrees w/ pain Goal status: INITIAL   3.  Aime will improve the following MMTs to >/= 4/5 to show improvement in strength:     Evaluation/Baseline:   UPPER EXTREMITY MMT:  MMT Right (Eval) Left (Eval)  Shoulder flexion 3-* in available range n  Shoulder abduction (C5) 3-* in available range n  Shoulder ER 3* n  Shoulder IR 3* n  Middle trapezius    Lower trapezius    Shoulder extension    Grip strength    Cervical flexion (C1,C2)    Cervical S/B (C3)    Shoulder shrug (C4)    Elbow flexion (C6)    Elbow ext (C7)    Thumb ext (C8)    Finger abd (T1)    Grossly     (Blank rows = not tested, score listed is out of 5 possible points.  N = WNL, D = diminished, C = clear for gross weakness with myotome testing, * = concordant pain with testing)  Goal status: INITIAL   4.  Tyner will show a >/= 20 pt improvement in his QUICK DASH score (MCID is 10% or ~5 pts) as a proxy for functional improvement   Evaluation/Baseline: 43 pts Goal status: INITIAL   PLAN: PT FREQUENCY: 1-2x/week  PT DURATION: 8 weeks  PLANNED INTERVENTIONS:  97164- PT Re-evaluation, 97110-Therapeutic exercises, 97530- Therapeutic activity, 97112- Neuromuscular re-education, 97535- Self Care, 16109- Manual therapy, [id number]- Gait training, [id number]- Aquatic Therapy, [id number]- Electrical stimulation (manual), [id number]- Vasopneumatic device, [id number]- Traction (mechanical), [id number]- Ionotophoresis 4mg /ml Dexamethasone , Taping, Dry Needling, Joint manipulation, and Spinal manipulation.   Raquelle Pietro PT, DPT 02/20/2024, 12:28 PM

## 2024-02-22 ENCOUNTER — Encounter: Payer: Self-pay | Admitting: Physical Therapy

## 2024-02-22 ENCOUNTER — Ambulatory Visit: Admitting: Physical Therapy

## 2024-02-22 DIAGNOSIS — M25512 Pain in left shoulder: Secondary | ICD-10-CM | POA: Diagnosis not present

## 2024-02-22 DIAGNOSIS — R600 Localized edema: Secondary | ICD-10-CM

## 2024-02-22 DIAGNOSIS — M6281 Muscle weakness (generalized): Secondary | ICD-10-CM

## 2024-02-22 NOTE — Therapy (Signed)
 OUTPATIENT PHYSICAL THERAPY DAILY NOTE   Patient Name: Devin Marsh MRN: [id number] DOB:05/15/1986, 38 y.o., male Today's Date: 02/22/2024   PT End of Session - 02/22/24 1029     Visit Number 3    Number of Visits --   1-2x/week   Date for PT Re-Evaluation 04/07/24    Authorization Type MCR - Quick dash    Progress Note Due on Visit 10    PT Start Time 1030    PT Stop Time 1110    PT Time Calculation (min) 40 min             Past Medical History:  Diagnosis Date   Abscess 04/13/2013   Arthritis    GERD (gastroesophageal reflux disease)    Headache    Hypertension    Multiple sclerosis (HCC)    Past Surgical History:  Procedure Laterality Date   APPENDECTOMY     INCISION AND DRAINAGE PERIRECTAL ABSCESS N/A 04/13/2013   Procedure: IRRIGATION AND DEBRIDEMENT PERIRECTAL ABSCESS;  Surgeon: Fran Imus, MD;  Location: [facility] OR;  Service: General;  Laterality: N/A;   SHOULDER ARTHROSCOPY WITH DISTAL CLAVICLE RESECTION Left 01/07/2024   Procedure: LEFT SHOULDER ARTHROSCOPY WITH DISTAL CLAVICLE EXCISION, BURSECTOMY;  Surgeon: Jasmine Mesi, MD;  Location: [facility] OR;  Service: Orthopedics;  Laterality: Left;   TOE SURGERY     WISDOM TOOTH EXTRACTION     Patient Active Problem List   Diagnosis Date Noted   Bursitis of left shoulder 01/19/2024   Arthritis of left acromioclavicular joint 01/19/2024   Multiple sclerosis (HCC) 09/11/2023   Rectal bleeding 06/15/2020   Anal itching 06/15/2020   Non-traumatic rhabdomyolysis    Rhabdomyolysis 05/11/2015   Tobacco abuse 05/11/2015    PCP: Marius Siemens, NP  REFERRING PROVIDER: Jasmine Mesi, MD  THERAPY DIAG:  Left shoulder pain, unspecified chronicity  Muscle weakness  Localized edema  REFERRING DIAG: Arthralgia of left acromioclavicular joint [M25.512], Chronic left shoulder pain [M25.512, G89.29]   Rationale for Evaluation and Treatment:  Rehabilitation  SUBJECTIVE:  PERTINENT PAST HISTORY:  MS  (weakness)       PRECAUTIONS: Distal clavicle excision and debridement of anterior superior labrum 3/11  OP Date: 01/07/2024  2 weeks 01/21/2024  4 weeks 02/04/2024  6 weeks 02/18/2024  8 weeks 03/03/2024  10 weeks 03/17/2024  12 weeks 03/31/2024    WEIGHT BEARING RESTRICTIONS Yes see precuations  FALLS:  Has patient fallen in last 6 months? Yes, Number of falls: 1 fall in shower  MOI/History of condition:  Onset date: 3/11  SUBJECTIVE STATEMENT  02/22/2024: Pt reports that he was sore after last visit but this has mostly subsided.  EVAL: Devin Marsh is a 38 y.o. male who presents to clinic with chief complaint of L shoulder pain following shoulder scope and DCE on 3/11.  Pt reports continued pain following surgery.  He was having neck and shoulder pain following surgery.  He feels his mobility is improving but is still limited.   Red flags:  denies   Pain:  Are you having pain? Yes Pain location: UT and L shoulder NPRS scale:  4/10 to 9/10 Aggravating factors: shoulder movement Relieving factors: rest, ice Pain description: sharp and aching Stage: Subacute 24 hour pattern: worse with activity   Occupation: NA  Assistive Device: NA  Hand Dominance: R  Patient Goals/Specific Activities: reduce pain and improve shoulder ROM   OBJECTIVE:   DIAGNOSTIC FINDINGS:   MRI pre surgery  IMPRESSION: 1. Moderate supraspinatus tendinopathy  anteriorly. 2. Moderate degenerative spurring and subcortical marrow edema at the acromioclavicular joint. 3. Mild subacromial subdeltoid bursitis. 4. Mild degenerative chondral thinning in the glenohumeral joint.  GENERAL OBSERVATION: Fwd head rounded shoulders     SENSATION: Light touch: Appears intact   PALPATION: TTP L UT and over incision site  UPPER EXTREMITY AROM:  ROM Right (Eval) Left (Eval)  Shoulder flexion 150 60*  Shoulder abduction 150 60*  Shoulder internal rotation    Shoulder external rotation     Functional IR n Unable to tolerate  Functional ER n Unable to tolerate  Shoulder extension    Elbow extension    Elbow flexion     (Blank rows = not tested, N = WNL, * = concordant pain with testing)  UPPER EXTREMITY MMT:  MMT Right (Eval) Left (Eval)  Shoulder flexion 3-* in available range n  Shoulder abduction (C5) 3-* in available range n  Shoulder ER 3* n  Shoulder IR 3* n  Middle trapezius    Lower trapezius    Shoulder extension    Grip strength    Cervical flexion (C1,C2)    Cervical S/B (C3)    Shoulder shrug (C4)    Elbow flexion (C6)    Elbow ext (C7)    Thumb ext (C8)    Finger abd (T1)    Grossly     (Blank rows = not tested, score listed is out of 5 possible points.  N = WNL, D = diminished, C = clear for gross weakness with myotome testing, * = concordant pain with testing)  UPPER EXTREMITY PROM:  PROM Right (Eval) Left (Eval)   Shoulder flexion 65*    Shoulder abduction     Shoulder internal rotation     Shoulder external rotation 20*    Functional IR     Functional ER     Shoulder extension     Elbow extension     Elbow flexion      (Blank rows = not tested, N = WNL, * = concordant pain with testing)  SPECIAL TESTS: NA  JOINT MOBILITY TESTING:  NA  PATIENT SURVEYS:  Quick Dash: 43 pts    TODAY'S TREATMENT:   [facility] Adult PT Treatment  02/20/2024:  Therapeutic Exercise: Prone chest press with dowel - 2x15  AAROM ER - 2x10 Low row - RTB - 2x15 Shoulder ext - RTB - 2x10 Isometric ER walkout - YTB - 10x Shoulder flexion ROM - relatively comfortable arc Towel slide at wall  Modalities:  Vasopneumatic (Game Ready)    Location:  left shoulder Time:  10 minutes Pressure:  medium Temperature:  40 degrees    HOME EXERCISE PROGRAM: Access Code: [email or web address] URL: [email or web address] Date: 02/22/2024 Prepared by: Lesleigh Rash  Exercises - Shoulder External Rotation Reactive Isometrics  - 1 x daily - 7 x weekly  - 3 sets - 10 reps - Shoulder Internal Rotation Reactive Isometrics  - 1 x daily - 7 x weekly - 3 sets - 10 reps - Seated Shoulder Flexion Towel Slide at Table Top  - 3 x daily - 7 x weekly - 3 sets - 10 reps - Shoulder Flexion Wall Slide with Towel  - 2 x daily - 7 x weekly - 2-3 sets - 10 reps - Standing Shoulder Row with Anchored Resistance  - 1 x daily - 7 x weekly - 3 sets - 10 reps - Shoulder extension with resistance - Neutral  - 1 x daily - 7 x weekly -  3 sets - 10 reps  Treatment priorities   Eval        ROM        Gentle shoulder strength                                  ASSESSMENT:  CLINICAL IMPRESSION:  02/22/2024:  Devin Marsh tolerated session well with no adverse reaction.  Pt reports that he was sore after last visit so intensity was kept roughly the same.  We were able to add in some light periscapular strengthening to good effect.  HEP updated.  EVAL:  Devin Marsh is a 38 y.o. male who presents to clinic with signs and sxs consistent with L shoulder pain following distal clavicle excision and labral debridement on 3/11.  He will benefit from skilled PT to improve his shoulder ROM, strength, and function to allow completion of daily tasks involving lifting and reaching.  OBJECTIVE IMPAIRMENTS: Pain, L shoulder ROM, L shoulder strength  ACTIVITY LIMITATIONS: reaching, lifting, driving  PERSONAL FACTORS: See medical history and pertinent history   REHAB POTENTIAL: Good  CLINICAL DECISION MAKING: Evolving/moderate complexity  EVALUATION COMPLEXITY: Moderate   GOALS:   SHORT TERM GOALS: Target date: 03/10/2024  Devin Marsh will be >75% HEP compliant to improve carryover between sessions and facilitate independent management of condition  Evaluation: ongoing Goal status: INITIAL   LONG TERM GOALS: Target date: 04/07/2024   Devin Marsh will self report >/= 50% decrease in pain from evaluation to improve function in daily tasks  Evaluation/Baseline: 9/10 max  pain Goal status: INITIAL   2.  Devin Marsh will demonstrate >130 degrees of active ROM in flexion to allow completion of activities involving reaching OH, not limited by pain  Evaluation/Baseline: 60 degrees w/ pain Goal status: INITIAL   3.  Devin Marsh will improve the following MMTs to >/= 4/5 to show improvement in strength:     Evaluation/Baseline:   UPPER EXTREMITY MMT:  MMT Right (Eval) Left (Eval)  Shoulder flexion 3-* in available range n  Shoulder abduction (C5) 3-* in available range n  Shoulder ER 3* n  Shoulder IR 3* n  Middle trapezius    Lower trapezius    Shoulder extension    Grip strength    Cervical flexion (C1,C2)    Cervical S/B (C3)    Shoulder shrug (C4)    Elbow flexion (C6)    Elbow ext (C7)    Thumb ext (C8)    Finger abd (T1)    Grossly     (Blank rows = not tested, score listed is out of 5 possible points.  N = WNL, D = diminished, C = clear for gross weakness with myotome testing, * = concordant pain with testing)  Goal status: INITIAL   4.  Devin Marsh will show a >/= 20 pt improvement in his QUICK DASH score (MCID is 10% or ~5 pts) as a proxy for functional improvement   Evaluation/Baseline: 43 pts Goal status: INITIAL   PLAN: PT FREQUENCY: 1-2x/week  PT DURATION: 8 weeks  PLANNED INTERVENTIONS:  97164- PT Re-evaluation, 97110-Therapeutic exercises, 97530- Therapeutic activity, 97112- Neuromuscular re-education, 97535- Self Care, 16109- Manual therapy, [id number]- Gait training, [id number]- Aquatic Therapy, [id number]- Electrical stimulation (manual), [id number]- Vasopneumatic device, [id number]- Traction (mechanical), [id number]- Ionotophoresis 4mg /ml Dexamethasone , Taping, Dry Needling, Joint manipulation, and Spinal manipulation.   Devin Marsh PT, DPT 02/22/2024, 11:10 AM

## 2024-02-29 ENCOUNTER — Encounter: Payer: Self-pay | Admitting: Physical Therapy

## 2024-02-29 ENCOUNTER — Ambulatory Visit: Attending: Orthopedic Surgery | Admitting: Physical Therapy

## 2024-02-29 DIAGNOSIS — M6281 Muscle weakness (generalized): Secondary | ICD-10-CM | POA: Insufficient documentation

## 2024-02-29 DIAGNOSIS — M25512 Pain in left shoulder: Secondary | ICD-10-CM | POA: Insufficient documentation

## 2024-02-29 DIAGNOSIS — R600 Localized edema: Secondary | ICD-10-CM | POA: Diagnosis present

## 2024-02-29 NOTE — Therapy (Signed)
 OUTPATIENT PHYSICAL THERAPY DAILY NOTE   Patient Name: Devin Marsh MRN: [id number] DOB:February 21, 1986, 38 y.o., male Today's Date: 02/29/2024   PT End of Session - 02/29/24 1118     Visit Number 4    Number of Visits --   1-2x/week   Date for PT Re-Evaluation 04/07/24    Authorization Type MCR - Quick dash    Progress Note Due on Visit 10    PT Start Time 1117    PT Stop Time 1157    PT Time Calculation (min) 40 min             Past Medical History:  Diagnosis Date   Abscess 04/13/2013   Arthritis    GERD (gastroesophageal reflux disease)    Headache    Hypertension    Multiple sclerosis (HCC)    Past Surgical History:  Procedure Laterality Date   APPENDECTOMY     INCISION AND DRAINAGE PERIRECTAL ABSCESS N/A 04/13/2013   Procedure: IRRIGATION AND DEBRIDEMENT PERIRECTAL ABSCESS;  Surgeon: Fran Imus, MD;  Location: [facility] OR;  Service: General;  Laterality: N/A;   SHOULDER ARTHROSCOPY WITH DISTAL CLAVICLE RESECTION Left 01/07/2024   Procedure: LEFT SHOULDER ARTHROSCOPY WITH DISTAL CLAVICLE EXCISION, BURSECTOMY;  Surgeon: Jasmine Mesi, MD;  Location: [facility] OR;  Service: Orthopedics;  Laterality: Left;   TOE SURGERY     WISDOM TOOTH EXTRACTION     Patient Active Problem List   Diagnosis Date Noted   Bursitis of left shoulder 01/19/2024   Arthritis of left acromioclavicular joint 01/19/2024   Multiple sclerosis (HCC) 09/11/2023   Rectal bleeding 06/15/2020   Anal itching 06/15/2020   Non-traumatic rhabdomyolysis    Rhabdomyolysis 05/11/2015   Tobacco abuse 05/11/2015    PCP: Marius Siemens, NP  REFERRING PROVIDER: Jasmine Mesi, MD  THERAPY DIAG:  Left shoulder pain, unspecified chronicity  Muscle weakness  Localized edema  REFERRING DIAG: Arthralgia of left acromioclavicular joint [M25.512], Chronic left shoulder pain [M25.512, G89.29]   Rationale for Evaluation and Treatment:  Rehabilitation  SUBJECTIVE:  PERTINENT PAST HISTORY:  MS  (weakness)       PRECAUTIONS: Distal clavicle excision and debridement of anterior superior labrum 3/11  OP Date: 01/07/2024  2 weeks 01/21/2024  4 weeks 02/04/2024  6 weeks 02/18/2024  8 weeks 03/03/2024  10 weeks 03/17/2024  12 weeks 03/31/2024    WEIGHT BEARING RESTRICTIONS Yes see precuations  FALLS:  Has patient fallen in last 6 months? Yes, Number of falls: 1 fall in shower  MOI/History of condition:  Onset date: 3/11  SUBJECTIVE STATEMENT  02/29/2024: Pt reports that his shoulder pain is improving.  EVAL: Devin Marsh is a 38 y.o. male who presents to clinic with chief complaint of L shoulder pain following shoulder scope and DCE on 3/11.  Pt reports continued pain following surgery.  He was having neck and shoulder pain following surgery.  He feels his mobility is improving but is still limited.   Red flags:  denies   Pain:  Are you having pain? Yes Pain location: UT and L shoulder NPRS scale:  3/10 to 7/10 Aggravating factors: shoulder movement Relieving factors: rest, ice Pain description: sharp and aching Stage: Subacute 24 hour pattern: worse with activity   Occupation: NA  Assistive Device: NA  Hand Dominance: R  Patient Goals/Specific Activities: reduce pain and improve shoulder ROM   OBJECTIVE:   DIAGNOSTIC FINDINGS:   MRI pre surgery  IMPRESSION: 1. Moderate supraspinatus tendinopathy anteriorly. 2. Moderate degenerative spurring and  subcortical marrow edema at the acromioclavicular joint. 3. Mild subacromial subdeltoid bursitis. 4. Mild degenerative chondral thinning in the glenohumeral joint.  GENERAL OBSERVATION: Fwd head rounded shoulders     SENSATION: Light touch: Appears intact   PALPATION: TTP L UT and over incision site  UPPER EXTREMITY AROM:  ROM Right (Eval) Left (Eval)  Shoulder flexion 150 60*  Shoulder abduction 150 60*  Shoulder internal rotation    Shoulder external rotation    Functional IR n Unable to tolerate   Functional ER n Unable to tolerate  Shoulder extension    Elbow extension    Elbow flexion     (Blank rows = not tested, N = WNL, * = concordant pain with testing)  UPPER EXTREMITY MMT:  MMT Right (Eval) Left (Eval)  Shoulder flexion 3-* in available range n  Shoulder abduction (C5) 3-* in available range n  Shoulder ER 3* n  Shoulder IR 3* n  Middle trapezius    Lower trapezius    Shoulder extension    Grip strength    Cervical flexion (C1,C2)    Cervical S/B (C3)    Shoulder shrug (C4)    Elbow flexion (C6)    Elbow ext (C7)    Thumb ext (C8)    Finger abd (T1)    Grossly     (Blank rows = not tested, score listed is out of 5 possible points.  N = WNL, D = diminished, C = clear for gross weakness with myotome testing, * = concordant pain with testing)  UPPER EXTREMITY PROM:  PROM Right (Eval) Left (Eval)   Shoulder flexion 65*    Shoulder abduction     Shoulder internal rotation     Shoulder external rotation 20*    Functional IR     Functional ER     Shoulder extension     Elbow extension     Elbow flexion      (Blank rows = not tested, N = WNL, * = concordant pain with testing)  SPECIAL TESTS: NA  JOINT MOBILITY TESTING:  NA  PATIENT SURVEYS:  Quick Dash: 43 pts    TODAY'S TREATMENT:   [facility] Adult PT Treatment  02/29/2024:  Therapeutic Exercise: Prone chest press with dowel - 2x10 Supine hrz abd - 2x10 - YTB Unilateral chest press - 3# Low row - Black TB to neutral - 2x15 Shoulder ext - RTB - 2x10 Standing ER with RTB - 3x10  Modalities:  Vasopneumatic (Game Ready)    Location:  left shoulder Time:  10 minutes Pressure:  medium Temperature:  40 degrees    HOME EXERCISE PROGRAM: Access Code: [email or web address] URL: [email or web address] Date: 02/29/2024 Prepared by: Lesleigh Rash  Exercises - Shoulder Internal Rotation Reactive Isometrics  - 1 x daily - 7 x weekly - 3 sets - 10 reps - Shoulder External Rotation with  Anchored Resistance  - 1 x daily - 7 x weekly - 3 sets - 10 reps - Shoulder Flexion Wall Slide with Towel  - 2 x daily - 7 x weekly - 2-3 sets - 10 reps - Standing Shoulder Row with Anchored Resistance  - 1 x daily - 7 x weekly - 3 sets - 10 reps - Shoulder extension with resistance - Neutral  - 1 x daily - 7 x weekly - 3 sets - 10 reps  Treatment priorities   Eval        ROM        Gentle shoulder strength  ASSESSMENT:  CLINICAL IMPRESSION:  02/29/2024:  Duwayne tolerated session well with no adverse reaction.  We continue to work on basic strengthening and ROM.  Dracen continues to have pain at end range flexion but is able to tolerate more resistance today and was able to move to concentric ER with band.  HEP updated.  EVAL:  Latwan is a 38 y.o. male who presents to clinic with signs and sxs consistent with L shoulder pain following distal clavicle excision and labral debridement on 3/11.  He will benefit from skilled PT to improve his shoulder ROM, strength, and function to allow completion of daily tasks involving lifting and reaching.  OBJECTIVE IMPAIRMENTS: Pain, L shoulder ROM, L shoulder strength  ACTIVITY LIMITATIONS: reaching, lifting, driving  PERSONAL FACTORS: See medical history and pertinent history   REHAB POTENTIAL: Good  CLINICAL DECISION MAKING: Evolving/moderate complexity  EVALUATION COMPLEXITY: Moderate   GOALS:   SHORT TERM GOALS: Target date: 03/10/2024  Austinjames will be >75% HEP compliant to improve carryover between sessions and facilitate independent management of condition  Evaluation: ongoing Goal status: INITIAL   LONG TERM GOALS: Target date: 04/07/2024   Abb will self report >/= 50% decrease in pain from evaluation to improve function in daily tasks  Evaluation/Baseline: 9/10 max pain Goal status: INITIAL   2.  Zula Hitch will demonstrate >130 degrees of active ROM in flexion to allow  completion of activities involving reaching OH, not limited by pain  Evaluation/Baseline: 60 degrees w/ pain Goal status: INITIAL   3.  Williom will improve the following MMTs to >/= 4/5 to show improvement in strength:     Evaluation/Baseline:   UPPER EXTREMITY MMT:  MMT Right (Eval) Left (Eval)  Shoulder flexion 3-* in available range n  Shoulder abduction (C5) 3-* in available range n  Shoulder ER 3* n  Shoulder IR 3* n  Middle trapezius    Lower trapezius    Shoulder extension    Grip strength    Cervical flexion (C1,C2)    Cervical S/B (C3)    Shoulder shrug (C4)    Elbow flexion (C6)    Elbow ext (C7)    Thumb ext (C8)    Finger abd (T1)    Grossly     (Blank rows = not tested, score listed is out of 5 possible points.  N = WNL, D = diminished, C = clear for gross weakness with myotome testing, * = concordant pain with testing)  Goal status: INITIAL   4.  Din will show a >/= 20 pt improvement in his QUICK DASH score (MCID is 10% or ~5 pts) as a proxy for functional improvement   Evaluation/Baseline: 43 pts Goal status: INITIAL   PLAN: PT FREQUENCY: 1-2x/week  PT DURATION: 8 weeks  PLANNED INTERVENTIONS:  97164- PT Re-evaluation, 97110-Therapeutic exercises, 97530- Therapeutic activity, 97112- Neuromuscular re-education, 97535- Self Care, 78469- Manual therapy, [id number]- Gait training, [id number]- Aquatic Therapy, [id number]- Electrical stimulation (manual), [id number]- Vasopneumatic device, [id number]- Traction (mechanical), [id number]- Ionotophoresis 4mg /ml Dexamethasone , Taping, Dry Needling, Joint manipulation, and Spinal manipulation.   Ronny Ruddell PT, DPT 02/29/2024, 12:01 PM

## 2024-03-03 ENCOUNTER — Encounter: Payer: Self-pay | Admitting: Physical Therapy

## 2024-03-03 ENCOUNTER — Ambulatory Visit: Admitting: Physical Therapy

## 2024-03-03 DIAGNOSIS — M6281 Muscle weakness (generalized): Secondary | ICD-10-CM

## 2024-03-03 DIAGNOSIS — M25512 Pain in left shoulder: Secondary | ICD-10-CM

## 2024-03-03 DIAGNOSIS — R600 Localized edema: Secondary | ICD-10-CM

## 2024-03-03 NOTE — Therapy (Signed)
 OUTPATIENT PHYSICAL THERAPY DAILY NOTE   Patient Name: Devin Marsh MRN: [id number] DOB:1986-09-04, 38 y.o., male Today's Date: 03/03/2024   PT End of Session - 03/03/24 1147     Visit Number 5    Number of Visits --   1-2x/week   Date for PT Re-Evaluation 04/07/24    Authorization Type MCR - Quick dash    Progress Note Due on Visit 10    PT Start Time 1147    PT Stop Time 1226    PT Time Calculation (min) 39 min             Past Medical History:  Diagnosis Date   Abscess 04/13/2013   Arthritis    GERD (gastroesophageal reflux disease)    Headache    Hypertension    Multiple sclerosis (HCC)    Past Surgical History:  Procedure Laterality Date   APPENDECTOMY     INCISION AND DRAINAGE PERIRECTAL ABSCESS N/A 04/13/2013   Procedure: IRRIGATION AND DEBRIDEMENT PERIRECTAL ABSCESS;  Surgeon: Fran Imus, MD;  Location: [facility] OR;  Service: General;  Laterality: N/A;   SHOULDER ARTHROSCOPY WITH DISTAL CLAVICLE RESECTION Left 01/07/2024   Procedure: LEFT SHOULDER ARTHROSCOPY WITH DISTAL CLAVICLE EXCISION, BURSECTOMY;  Surgeon: Jasmine Mesi, MD;  Location: [facility] OR;  Service: Orthopedics;  Laterality: Left;   TOE SURGERY     WISDOM TOOTH EXTRACTION     Patient Active Problem List   Diagnosis Date Noted   Bursitis of left shoulder 01/19/2024   Arthritis of left acromioclavicular joint 01/19/2024   Multiple sclerosis (HCC) 09/11/2023   Rectal bleeding 06/15/2020   Anal itching 06/15/2020   Non-traumatic rhabdomyolysis    Rhabdomyolysis 05/11/2015   Tobacco abuse 05/11/2015    PCP: Marius Siemens, NP  REFERRING PROVIDER: Jasmine Mesi, MD  THERAPY DIAG:  Left shoulder pain, unspecified chronicity  Muscle weakness  Localized edema  REFERRING DIAG: Arthralgia of left acromioclavicular joint [M25.512], Chronic left shoulder pain [M25.512, G89.29]   Rationale for Evaluation and Treatment:  Rehabilitation  SUBJECTIVE:  PERTINENT PAST HISTORY:  MS  (weakness)       PRECAUTIONS: Distal clavicle excision and debridement of anterior superior labrum 3/11  OP Date: 01/07/2024  2 weeks 01/21/2024  4 weeks 02/04/2024  6 weeks 02/18/2024  8 weeks 03/03/2024  10 weeks 03/17/2024  12 weeks 03/31/2024    WEIGHT BEARING RESTRICTIONS Yes see precuations  FALLS:  Has patient fallen in last 6 months? Yes, Number of falls: 1 fall in shower  MOI/History of condition:  Onset date: 3/11  SUBJECTIVE STATEMENT  03/03/2024: Pt reports that starting yesterday his shoulder has hurting more.  He's not sure exactly why, "maybe I slept on it wrong".  He feels it may also be exacerbated by his MS (he is taking his injection tomorrow or today)  EVAL: Terryon Sortino is a 38 y.o. male who presents to clinic with chief complaint of L shoulder pain following shoulder scope and DCE on 3/11.  Pt reports continued pain following surgery.  He was having neck and shoulder pain following surgery.  He feels his mobility is improving but is still limited.   Red flags:  denies   Pain:  Are you having pain? Yes Pain location: UT and L shoulder NPRS scale:  3/10 to 7/10 Aggravating factors: shoulder movement Relieving factors: rest, ice Pain description: sharp and aching Stage: Subacute 24 hour pattern: worse with activity   Occupation: NA  Assistive Device: NA  Hand Dominance: R  Patient Goals/Specific Activities: reduce pain and improve shoulder ROM   OBJECTIVE:   DIAGNOSTIC FINDINGS:   MRI pre surgery  IMPRESSION: 1. Moderate supraspinatus tendinopathy anteriorly. 2. Moderate degenerative spurring and subcortical marrow edema at the acromioclavicular joint. 3. Mild subacromial subdeltoid bursitis. 4. Mild degenerative chondral thinning in the glenohumeral joint.  GENERAL OBSERVATION: Fwd head rounded shoulders     SENSATION: Light touch: Appears intact   PALPATION: TTP L UT and over incision site  UPPER EXTREMITY AROM:  ROM Right (Eval)  Left (Eval)  Shoulder flexion 150 60*  Shoulder abduction 150 60*  Shoulder internal rotation    Shoulder external rotation    Functional IR n Unable to tolerate  Functional ER n Unable to tolerate  Shoulder extension    Elbow extension    Elbow flexion     (Blank rows = not tested, N = WNL, * = concordant pain with testing)  UPPER EXTREMITY MMT:  MMT Right (Eval) Left (Eval)  Shoulder flexion 3-* in available range n  Shoulder abduction (C5) 3-* in available range n  Shoulder ER 3* n  Shoulder IR 3* n  Middle trapezius    Lower trapezius    Shoulder extension    Grip strength    Cervical flexion (C1,C2)    Cervical S/B (C3)    Shoulder shrug (C4)    Elbow flexion (C6)    Elbow ext (C7)    Thumb ext (C8)    Finger abd (T1)    Grossly     (Blank rows = not tested, score listed is out of 5 possible points.  N = WNL, D = diminished, C = clear for gross weakness with myotome testing, * = concordant pain with testing)  UPPER EXTREMITY PROM:  PROM Right (Eval) Left (Eval)   Shoulder flexion 65*    Shoulder abduction     Shoulder internal rotation     Shoulder external rotation 20*    Functional IR     Functional ER     Shoulder extension     Elbow extension     Elbow flexion      (Blank rows = not tested, N = WNL, * = concordant pain with testing)  SPECIAL TESTS: NA  JOINT MOBILITY TESTING:  NA  PATIENT SURVEYS:  Quick Dash: 43 pts    TODAY'S TREATMENT:   [facility] Adult PT Treatment  03/03/2024:  Therapeutic Exercise: UBE - L0 - stopped d/t pain Prone chest press with dowel - 2x10 Supine shoulder flexion - non-painful arc  Manual Therapy  STM L UT and L LS  Modalities:  Vasopneumatic (Game Ready)    Location:  left shoulder Time:  10 minutes Pressure:  medium Temperature:  40 degrees    HOME EXERCISE PROGRAM: Access Code: [email or web address] URL: [email or web address] Date: 02/29/2024 Prepared by: Lesleigh Rash  Exercises -  Shoulder Internal Rotation Reactive Isometrics  - 1 x daily - 7 x weekly - 3 sets - 10 reps - Shoulder External Rotation with Anchored Resistance  - 1 x daily - 7 x weekly - 3 sets - 10 reps - Shoulder Flexion Wall Slide with Towel  - 2 x daily - 7 x weekly - 2-3 sets - 10 reps - Standing Shoulder Row with Anchored Resistance  - 1 x daily - 7 x weekly - 3 sets - 10 reps - Shoulder extension with resistance - Neutral  - 1 x daily - 7 x weekly - 3 sets - 10 reps  Treatment  priorities   Eval        ROM        Gentle shoulder strength                                  ASSESSMENT:  CLINICAL IMPRESSION:  03/03/2024:  Yamil tolerated session fair with no adverse reaction.  His AAROM/PROM is WNL but he continues to have high levels of pain.  His pain has worsened over the last couple of days with no clear cause.  He feels the increased pain may be related to his MS which can cause muscle pain at times.  Today he has difficulty tolerating even gentle ROM d/t pain.  Trialed MT to L UT which did improve his pain slightly.  He will follow up with MD tomorrow.  EVAL:  Jaydon is a 38 y.o. male who presents to clinic with signs and sxs consistent with L shoulder pain following distal clavicle excision and labral debridement on 3/11.  He will benefit from skilled PT to improve his shoulder ROM, strength, and function to allow completion of daily tasks involving lifting and reaching.  OBJECTIVE IMPAIRMENTS: Pain, L shoulder ROM, L shoulder strength  ACTIVITY LIMITATIONS: reaching, lifting, driving  PERSONAL FACTORS: See medical history and pertinent history   REHAB POTENTIAL: Good  CLINICAL DECISION MAKING: Evolving/moderate complexity  EVALUATION COMPLEXITY: Moderate   GOALS:   SHORT TERM GOALS: Target date: 03/10/2024  Paulos will be >75% HEP compliant to improve carryover between sessions and facilitate independent management of condition  Evaluation: ongoing Goal status:  INITIAL   LONG TERM GOALS: Target date: 04/07/2024   Kerek will self report >/= 50% decrease in pain from evaluation to improve function in daily tasks  Evaluation/Baseline: 9/10 max pain Goal status: INITIAL   2.  Zula Hitch will demonstrate >130 degrees of active ROM in flexion to allow completion of activities involving reaching OH, not limited by pain  Evaluation/Baseline: 60 degrees w/ pain Goal status: INITIAL   3.  Karlo will improve the following MMTs to >/= 4/5 to show improvement in strength:     Evaluation/Baseline:   UPPER EXTREMITY MMT:  MMT Right (Eval) Left (Eval)  Shoulder flexion 3-* in available range n  Shoulder abduction (C5) 3-* in available range n  Shoulder ER 3* n  Shoulder IR 3* n  Middle trapezius    Lower trapezius    Shoulder extension    Grip strength    Cervical flexion (C1,C2)    Cervical S/B (C3)    Shoulder shrug (C4)    Elbow flexion (C6)    Elbow ext (C7)    Thumb ext (C8)    Finger abd (T1)    Grossly     (Blank rows = not tested, score listed is out of 5 possible points.  N = WNL, D = diminished, C = clear for gross weakness with myotome testing, * = concordant pain with testing)  Goal status: INITIAL   4.  Davonte will show a >/= 20 pt improvement in his QUICK DASH score (MCID is 10% or ~5 pts) as a proxy for functional improvement   Evaluation/Baseline: 43 pts Goal status: INITIAL   PLAN: PT FREQUENCY: 1-2x/week  PT DURATION: 8 weeks  PLANNED INTERVENTIONS:  97164- PT Re-evaluation, 97110-Therapeutic exercises, 97530- Therapeutic activity, [id number]- Neuromuscular re-education, 97535- Self Care, 82956- Manual therapy, [id number]- Gait training, [id number]- Aquatic Therapy, [id number]- Electrical stimulation (manual), [id number]- Vasopneumatic  device, [id number]- Traction (mechanical), [id number]- Ionotophoresis 4mg /ml Dexamethasone , Taping, Dry Needling, Joint manipulation, and Spinal manipulation.   Ladaisha Portillo PT, DPT 03/03/2024, 12:41  PM

## 2024-03-04 ENCOUNTER — Ambulatory Visit (INDEPENDENT_AMBULATORY_CARE_PROVIDER_SITE_OTHER): Admitting: Orthopedic Surgery

## 2024-03-04 ENCOUNTER — Encounter: Payer: Self-pay | Admitting: Orthopedic Surgery

## 2024-03-04 DIAGNOSIS — M25512 Pain in left shoulder: Secondary | ICD-10-CM

## 2024-03-04 NOTE — Progress Notes (Signed)
   Post-Op Visit Note   Patient: Devin Marsh           Date of Birth: 1986-09-08           MRN: [id number] Visit Date: 03/04/2024 PCP: Marius Siemens, NP   Assessment & Plan:  Chief Complaint:  Chief Complaint  Patient presents with   Left Shoulder - Routine Post Op     01/07/2024 left shoulder arthroscopy, DCE     Visit Diagnoses:  1. Arthralgia of left acromioclavicular joint     Plan: Patient presents for follow-up of left shoulder arthroscopy with distal clavicle excision 01/07/2024.  Patient states he has pain that comes and goes.  He is disabled.  Doing physical therapy 1-2 times a week.  He will cannot sleep on that left-hand side.  Hard for him to put on deodorant.  On examination he has pretty good range of motion above overhead.  Mild tenderness and some swelling around the left [facility] joint particular around that portal.  No erythema or induration.  Plan at this time is to continue Tylenol  for pain and physical therapy.  6-week return final check   Follow-Up Instructions: No follow-ups on file.   Orders:  No orders of the defined types were placed in this encounter.  No orders of the defined types were placed in this encounter.   Imaging: No results found.  PMFS History: Patient Active Problem List   Diagnosis Date Noted   Bursitis of left shoulder 01/19/2024   Arthritis of left acromioclavicular joint 01/19/2024   Multiple sclerosis (HCC) 09/11/2023   Rectal bleeding 06/15/2020   Anal itching 06/15/2020   Non-traumatic rhabdomyolysis    Rhabdomyolysis 05/11/2015   Tobacco abuse 05/11/2015   Past Medical History:  Diagnosis Date   Abscess 04/13/2013   Arthritis    GERD (gastroesophageal reflux disease)    Headache    Hypertension    Multiple sclerosis (HCC)     Family History  Problem Relation Age of Onset   Diabetes Mother    Asthma Brother    Colon cancer Neg Hx    Esophageal cancer Neg Hx    Rectal cancer Neg Hx    Stomach cancer Neg Hx      Past Surgical History:  Procedure Laterality Date   APPENDECTOMY     INCISION AND DRAINAGE PERIRECTAL ABSCESS N/A 04/13/2013   Procedure: IRRIGATION AND DEBRIDEMENT PERIRECTAL ABSCESS;  Surgeon: Fran Imus, MD;  Location: [facility] OR;  Service: General;  Laterality: N/A;   SHOULDER ARTHROSCOPY WITH DISTAL CLAVICLE RESECTION Left 01/07/2024   Procedure: LEFT SHOULDER ARTHROSCOPY WITH DISTAL CLAVICLE EXCISION, BURSECTOMY;  Surgeon: Jasmine Mesi, MD;  Location: [facility] OR;  Service: Orthopedics;  Laterality: Left;   TOE SURGERY     WISDOM TOOTH EXTRACTION     Social History   Occupational History   Not on file  Tobacco Use   Smoking status: Former    Current packs/day: 0.00    Types: Cigarettes    Quit date: 10/29/2017    Years since quitting: 6.3   Smokeless tobacco: Never  Vaping Use   Vaping status: Some Days  Substance and Sexual Activity   Alcohol use: No   Drug use: No   Sexual activity: Yes

## 2024-03-05 NOTE — Progress Notes (Signed)
 Moved to [facility] schedule.

## 2024-03-07 ENCOUNTER — Ambulatory Visit: Admitting: Physical Therapy

## 2024-03-07 ENCOUNTER — Encounter: Payer: Self-pay | Admitting: Physical Therapy

## 2024-03-07 DIAGNOSIS — R600 Localized edema: Secondary | ICD-10-CM

## 2024-03-07 DIAGNOSIS — M6281 Muscle weakness (generalized): Secondary | ICD-10-CM

## 2024-03-07 DIAGNOSIS — M25512 Pain in left shoulder: Secondary | ICD-10-CM

## 2024-03-07 NOTE — Therapy (Signed)
 OUTPATIENT PHYSICAL THERAPY DAILY NOTE   Patient Name: Devin Marsh MRN: [id number] DOB:1986-01-31, 38 y.o., male Today's Date: 03/07/2024   PT End of Session - 03/07/24 1033     Visit Number 6    Number of Visits --   1-2x/week   Date for PT Re-Evaluation 04/07/24    Authorization Type MCR - Quick dash    Progress Note Due on Visit 10    PT Start Time 1031    PT Stop Time 1112    PT Time Calculation (min) 41 min             Past Medical History:  Diagnosis Date   Abscess 04/13/2013   Arthritis    GERD (gastroesophageal reflux disease)    Headache    Hypertension    Multiple sclerosis (HCC)    Past Surgical History:  Procedure Laterality Date   APPENDECTOMY     INCISION AND DRAINAGE PERIRECTAL ABSCESS N/A 04/13/2013   Procedure: IRRIGATION AND DEBRIDEMENT PERIRECTAL ABSCESS;  Surgeon: Fran Imus, MD;  Location: [facility] OR;  Service: General;  Laterality: N/A;   SHOULDER ARTHROSCOPY WITH DISTAL CLAVICLE RESECTION Left 01/07/2024   Procedure: LEFT SHOULDER ARTHROSCOPY WITH DISTAL CLAVICLE EXCISION, BURSECTOMY;  Surgeon: Jasmine Mesi, MD;  Location: [facility] OR;  Service: Orthopedics;  Laterality: Left;   TOE SURGERY     WISDOM TOOTH EXTRACTION     Patient Active Problem List   Diagnosis Date Noted   Bursitis of left shoulder 01/19/2024   Arthritis of left acromioclavicular joint 01/19/2024   Multiple sclerosis (HCC) 09/11/2023   Rectal bleeding 06/15/2020   Anal itching 06/15/2020   Non-traumatic rhabdomyolysis    Rhabdomyolysis 05/11/2015   Tobacco abuse 05/11/2015    PCP: Marius Siemens, NP  REFERRING PROVIDER: Jasmine Mesi, MD  THERAPY DIAG:  Left shoulder pain, unspecified chronicity  Muscle weakness  Localized edema  REFERRING DIAG: Arthralgia of left acromioclavicular joint [M25.512], Chronic left shoulder pain [M25.512, G89.29]   Rationale for Evaluation and Treatment:  Rehabilitation  SUBJECTIVE:  PERTINENT PAST HISTORY:  MS  (weakness)       PRECAUTIONS: Distal clavicle excision and debridement of anterior superior labrum 3/11  OP Date: 01/07/2024  2 weeks 01/21/2024  4 weeks 02/04/2024  6 weeks 02/18/2024  8 weeks 03/03/2024  10 weeks 03/17/2024  12 weeks 03/31/2024    WEIGHT BEARING RESTRICTIONS Yes see precuations  FALLS:  Has patient fallen in last 6 months? Yes, Number of falls: 1 fall in shower  MOI/History of condition:  Onset date: 3/11  SUBJECTIVE STATEMENT  03/07/2024: Pt reports that he continues to have shoulder pain.  The MD suggest he continue to work on ROM and avoid lifting.  EVAL: Devin Marsh is a 38 y.o. male who presents to clinic with chief complaint of L shoulder pain following shoulder scope and DCE on 3/11.  Pt reports continued pain following surgery.  He was having neck and shoulder pain following surgery.  He feels his mobility is improving but is still limited.   Red flags:  denies   Pain:  Are you having pain? Yes Pain location: UT and L shoulder NPRS scale:  3/10 to 7/10 Aggravating factors: shoulder movement Relieving factors: rest, ice Pain description: sharp and aching Stage: Subacute 24 hour pattern: worse with activity   Occupation: NA  Assistive Device: NA  Hand Dominance: R  Patient Goals/Specific Activities: reduce pain and improve shoulder ROM   OBJECTIVE:   DIAGNOSTIC FINDINGS:   MRI  pre surgery  IMPRESSION: 1. Moderate supraspinatus tendinopathy anteriorly. 2. Moderate degenerative spurring and subcortical marrow edema at the acromioclavicular joint. 3. Mild subacromial subdeltoid bursitis. 4. Mild degenerative chondral thinning in the glenohumeral joint.  GENERAL OBSERVATION: Fwd head rounded shoulders     SENSATION: Light touch: Appears intact   PALPATION: TTP L UT and over incision site  UPPER EXTREMITY AROM:  ROM Right (Eval) Left (Eval)  Shoulder flexion 150 60*  Shoulder abduction 150 60*  Shoulder internal rotation     Shoulder external rotation    Functional IR n Unable to tolerate  Functional ER n Unable to tolerate  Shoulder extension    Elbow extension    Elbow flexion     (Blank rows = not tested, N = WNL, * = concordant pain with testing)  UPPER EXTREMITY MMT:  MMT Right (Eval) Left (Eval)  Shoulder flexion 3-* in available range n  Shoulder abduction (C5) 3-* in available range n  Shoulder ER 3* n  Shoulder IR 3* n  Middle trapezius    Lower trapezius    Shoulder extension    Grip strength    Cervical flexion (C1,C2)    Cervical S/B (C3)    Shoulder shrug (C4)    Elbow flexion (C6)    Elbow ext (C7)    Thumb ext (C8)    Finger abd (T1)    Grossly     (Blank rows = not tested, score listed is out of 5 possible points.  N = WNL, D = diminished, C = clear for gross weakness with myotome testing, * = concordant pain with testing)  UPPER EXTREMITY PROM:  PROM Right (Eval) Left (Eval)   Shoulder flexion 65*    Shoulder abduction     Shoulder internal rotation     Shoulder external rotation 20*    Functional IR     Functional ER     Shoulder extension     Elbow extension     Elbow flexion      (Blank rows = not tested, N = WNL, * = concordant pain with testing)  SPECIAL TESTS: NA  JOINT MOBILITY TESTING:  NA  PATIENT SURVEYS:  Quick Dash: 43 pts    TODAY'S TREATMENT:   [facility] Adult PT Treatment  03/07/2024:  Therapeutic Exercise: UBE - L0 - 3'/3' Prone chest press with dowel - 2x10 Supine shoulder flexion with dowel - non-painful arc to 90 degrees - 2# with dowel Supine flexion with 1 arm - 15x SA circles - stopped d/t pain Pendulums - 2x 20 ea  Manual Therapy  STM L UT and L LS Gentle flexion ROM with gentle oscillations   Modalities:  Vasopneumatic (Game Ready)    Location:  left shoulder Time:  10 minutes Pressure:  medium Temperature:  40 degrees    HOME EXERCISE PROGRAM: Access Code: [email or web address] URL: [email or web address] Date:  03/07/2024 Prepared by: Lesleigh Rash  Exercises - Shoulder Flexion Wall Slide with Towel  - 2 x daily - 7 x weekly - 2-3 sets - 10 reps - Circular Shoulder Pendulum with Table Support  - 1 x daily - 7 x weekly - 3 sets - 10 reps - Flexion-Extension Shoulder Pendulum with Table Support  - 1 x daily - 7 x weekly - 3 sets - 10 reps - Horizontal Shoulder Pendulum with Table Support  - 1 x daily - 7 x weekly - 3 sets - 10 reps - Supine Shoulder Flexion AAROM with Dowel  - 1  x daily - 7 x weekly - 3 sets - 10 reps  Treatment priorities   Eval        ROM        Gentle shoulder strength                                  ASSESSMENT:  CLINICAL IMPRESSION:  03/07/2024:  Dorrance tolerated session fair with no adverse reaction.  Concentrated mainly on gentle ROM today and strengthening in gravity reduced positions in comfortable range.  Overall lower pain today and better tolerance to exercise.  Updated HEP to include pendulums which were relieving in clinic.  Will continue to progress as able.  EVAL:  Shyler is a 38 y.o. male who presents to clinic with signs and sxs consistent with L shoulder pain following distal clavicle excision and labral debridement on 3/11.  He will benefit from skilled PT to improve his shoulder ROM, strength, and function to allow completion of daily tasks involving lifting and reaching.  OBJECTIVE IMPAIRMENTS: Pain, L shoulder ROM, L shoulder strength  ACTIVITY LIMITATIONS: reaching, lifting, driving  PERSONAL FACTORS: See medical history and pertinent history   REHAB POTENTIAL: Good  CLINICAL DECISION MAKING: Evolving/moderate complexity  EVALUATION COMPLEXITY: Moderate   GOALS:   SHORT TERM GOALS: Target date: 03/10/2024  Ohene will be >75% HEP compliant to improve carryover between sessions and facilitate independent management of condition  Evaluation: ongoing Goal status: MET   LONG TERM GOALS: Target date: 04/07/2024   Pravin will  self report >/= 50% decrease in pain from evaluation to improve function in daily tasks  Evaluation/Baseline: 9/10 max pain Goal status: INITIAL   2.  Zula Hitch will demonstrate >130 degrees of active ROM in flexion to allow completion of activities involving reaching OH, not limited by pain  Evaluation/Baseline: 60 degrees w/ pain Goal status: INITIAL   3.  Cruise will improve the following MMTs to >/= 4/5 to show improvement in strength:     Evaluation/Baseline:   UPPER EXTREMITY MMT:  MMT Right (Eval) Left (Eval)  Shoulder flexion 3-* in available range n  Shoulder abduction (C5) 3-* in available range n  Shoulder ER 3* n  Shoulder IR 3* n  Middle trapezius    Lower trapezius    Shoulder extension    Grip strength    Cervical flexion (C1,C2)    Cervical S/B (C3)    Shoulder shrug (C4)    Elbow flexion (C6)    Elbow ext (C7)    Thumb ext (C8)    Finger abd (T1)    Grossly     (Blank rows = not tested, score listed is out of 5 possible points.  N = WNL, D = diminished, C = clear for gross weakness with myotome testing, * = concordant pain with testing)  Goal status: INITIAL   4.  Giavonni will show a >/= 20 pt improvement in his QUICK DASH score (MCID is 10% or ~5 pts) as a proxy for functional improvement   Evaluation/Baseline: 43 pts Goal status: INITIAL   PLAN: PT FREQUENCY: 1-2x/week  PT DURATION: 8 weeks  PLANNED INTERVENTIONS:  97164- PT Re-evaluation, 97110-Therapeutic exercises, 97530- Therapeutic activity, 97112- Neuromuscular re-education, 97535- Self Care, 09811- Manual therapy, [id number]- Gait training, [id number]- Aquatic Therapy, [id number]- Electrical stimulation (manual), [id number]- Vasopneumatic device, [id number]- Traction (mechanical), [id number]- Ionotophoresis 4mg /ml Dexamethasone , Taping, Dry Needling, Joint manipulation, and Spinal manipulation.   Lesleigh Rash PT,  DPT 03/07/2024, 11:22 AM

## 2024-03-10 ENCOUNTER — Ambulatory Visit: Admitting: Physical Therapy

## 2024-03-11 ENCOUNTER — Encounter: Payer: Self-pay | Admitting: Physical Therapy

## 2024-03-11 ENCOUNTER — Ambulatory Visit: Admitting: Physical Therapy

## 2024-03-11 DIAGNOSIS — M25512 Pain in left shoulder: Secondary | ICD-10-CM | POA: Diagnosis not present

## 2024-03-11 DIAGNOSIS — R600 Localized edema: Secondary | ICD-10-CM

## 2024-03-11 DIAGNOSIS — M6281 Muscle weakness (generalized): Secondary | ICD-10-CM

## 2024-03-11 NOTE — Therapy (Unsigned)
 OUTPATIENT PHYSICAL THERAPY DAILY NOTE   Patient Name: Devin Marsh MRN: [id number] DOB:[birth year], 38 y.o., male Today's Date: 03/13/2024    PT End of Session - 03/13/24 1033       Visit Number 7    Number of Visits --   1-2x/week    Date for PT Re-Evaluation 04/07/24     Authorization Type MCR - Quick dash     Progress Note Due on Visit 10     PT Start Time 4:36    PT Stop Time 5:15    PT Time Calculation (min) 39 min       Past Medical History:  Diagnosis Date   Abscess 04/13/2013   Arthritis    GERD (gastroesophageal reflux disease)    Headache    Hypertension    Multiple sclerosis (HCC)    Past Surgical History:  Procedure Laterality Date   APPENDECTOMY     INCISION AND DRAINAGE PERIRECTAL ABSCESS N/A 04/13/2013   Procedure: IRRIGATION AND DEBRIDEMENT PERIRECTAL ABSCESS;  Surgeon: Fran Imus, MD;  Location: [facility] OR;  Service: General;  Laterality: N/A;   SHOULDER ARTHROSCOPY WITH DISTAL CLAVICLE RESECTION Left 01/07/2024   Procedure: LEFT SHOULDER ARTHROSCOPY WITH DISTAL CLAVICLE EXCISION, BURSECTOMY;  Surgeon: Jasmine Mesi, MD;  Location: [facility] OR;  Service: Orthopedics;  Laterality: Left;   TOE SURGERY     WISDOM TOOTH EXTRACTION     Patient Active Problem List   Diagnosis Date Noted   Bursitis of left shoulder 01/19/2024   Arthritis of left acromioclavicular joint 01/19/2024   Multiple sclerosis (HCC) 09/11/2023   Rectal bleeding 06/15/2020   Anal itching 06/15/2020   Non-traumatic rhabdomyolysis    Rhabdomyolysis 05/11/2015   Tobacco abuse 05/11/2015    PCP: Marius Siemens, NP  REFERRING PROVIDER: Jasmine Mesi, MD  THERAPY DIAG:  Left shoulder pain, unspecified chronicity  Muscle weakness  Localized edema  REFERRING DIAG: Arthralgia of left acromioclavicular joint [M25.512], Chronic left shoulder pain [M25.512, G89.29]   Rationale for Evaluation and Treatment:  Rehabilitation  SUBJECTIVE:  PERTINENT PAST HISTORY:  MS  (weakness)       PRECAUTIONS: Distal clavicle excision and debridement of anterior superior labrum 3/11  OP Date: 01/07/2024  2 weeks 01/21/2024  4 weeks 02/04/2024  6 weeks 02/18/2024  8 weeks 03/03/2024  10 weeks 03/17/2024  12 weeks 03/31/2024    WEIGHT BEARING RESTRICTIONS Yes see precuations  FALLS:  Has patient fallen in last 6 months? Yes, Number of falls: 1 fall in shower  MOI/History of condition:  Onset date: 3/11  SUBJECTIVE STATEMENT  03/13/2024: Pt reports continued slow improvement.  EVAL: Devin Marsh is a 38 y.o. male who presents to clinic with chief complaint of L shoulder pain following shoulder scope and DCE on 3/11.  Pt reports continued pain following surgery.  He was having neck and shoulder pain following surgery.  He feels his mobility is improving but is still limited.   Red flags:  denies   Pain:  Are you having pain? Yes Pain location: UT and L shoulder NPRS scale:  3/10 to 7/10 Aggravating factors: shoulder movement Relieving factors: rest, ice Pain description: sharp and aching Stage: Subacute 24 hour pattern: worse with activity   Occupation: NA  Assistive Device: NA  Hand Dominance: R  Patient Goals/Specific Activities: reduce pain and improve shoulder ROM   OBJECTIVE:   DIAGNOSTIC FINDINGS:   MRI pre surgery  IMPRESSION: 1. Moderate supraspinatus tendinopathy anteriorly. 2. Moderate degenerative spurring and subcortical marrow  edema at the acromioclavicular joint. 3. Mild subacromial subdeltoid bursitis. 4. Mild degenerative chondral thinning in the glenohumeral joint.  GENERAL OBSERVATION: Fwd head rounded shoulders     SENSATION: Light touch: Appears intact   PALPATION: TTP L UT and over incision site  UPPER EXTREMITY AROM:  ROM Right (Eval) Left (Eval)  Shoulder flexion 150 60*  Shoulder abduction 150 60*  Shoulder internal rotation    Shoulder external rotation    Functional IR n Unable to tolerate   Functional ER n Unable to tolerate  Shoulder extension    Elbow extension    Elbow flexion     (Blank rows = not tested, N = WNL, * = concordant pain with testing)  UPPER EXTREMITY MMT:  MMT Right (Eval) Left (Eval)  Shoulder flexion 3-* in available range n  Shoulder abduction (C5) 3-* in available range n  Shoulder ER 3* n  Shoulder IR 3* n  Middle trapezius    Lower trapezius    Shoulder extension    Grip strength    Cervical flexion (C1,C2)    Cervical S/B (C3)    Shoulder shrug (C4)    Elbow flexion (C6)    Elbow ext (C7)    Thumb ext (C8)    Finger abd (T1)    Grossly     (Blank rows = not tested, score listed is out of 5 possible points.  N = WNL, D = diminished, C = clear for gross weakness with myotome testing, * = concordant pain with testing)  UPPER EXTREMITY PROM:  PROM Right (Eval) Left (Eval)   Shoulder flexion 65*    Shoulder abduction     Shoulder internal rotation     Shoulder external rotation 20*    Functional IR     Functional ER     Shoulder extension     Elbow extension     Elbow flexion      (Blank rows = not tested, N = WNL, * = concordant pain with testing)  SPECIAL TESTS: NA  JOINT MOBILITY TESTING:  NA  PATIENT SURVEYS:  Quick Dash: 43 pts    TODAY'S TREATMENT:   [facility] Adult PT Treatment  03/13/2024:  Therapeutic Exercise: UBE - L1 - 3'/3' Prone chest press with dowel - 3x10 small range Supine flexion - small range - 3x10 SA circles - 20x CW/CC S/L ER - 2x10 Pendulums - 2x 20 ea UE ranger - 31'' - flexion and scaption - 15x ea  Modalities:  Vasopneumatic (Game Ready)    Location:  left shoulder Time:  10 minutes Pressure:  medium Temperature:  40 degrees    HOME EXERCISE PROGRAM: Access Code: [email or web address] URL: [email or web address] Date: 03/07/2024 Prepared by: Lesleigh Rash  Exercises - Shoulder Flexion Wall Slide with Towel  - 2 x daily - 7 x weekly - 2-3 sets - 10 reps - Circular  Shoulder Pendulum with Table Support  - 1 x daily - 7 x weekly - 3 sets - 10 reps - Flexion-Extension Shoulder Pendulum with Table Support  - 1 x daily - 7 x weekly - 3 sets - 10 reps - Horizontal Shoulder Pendulum with Table Support  - 1 x daily - 7 x weekly - 3 sets - 10 reps - Supine Shoulder Flexion AAROM with Dowel  - 1 x daily - 7 x weekly - 3 sets - 10 reps  Treatment priorities   Eval        ROM  Gentle shoulder strength                                  ASSESSMENT:  CLINICAL IMPRESSION:  03/13/2024:  Devin Marsh tolerated session fair with no adverse reaction.  Continued working on ROM.  He is consistently having less pain with AAROM exercise.  Suggested that he back off the intensity of his HEP for a few days and see if this helps his pain.  He has full PROM at this point so we will aim to maintain this while avoiding agging exercises.  EVAL:  Devin Marsh is a 38 y.o. male who presents to clinic with signs and sxs consistent with L shoulder pain following distal clavicle excision and labral debridement on 3/11.  He will benefit from skilled PT to improve his shoulder ROM, strength, and function to allow completion of daily tasks involving lifting and reaching.  OBJECTIVE IMPAIRMENTS: Pain, L shoulder ROM, L shoulder strength  ACTIVITY LIMITATIONS: reaching, lifting, driving  PERSONAL FACTORS: See medical history and pertinent history   REHAB POTENTIAL: Good  CLINICAL DECISION MAKING: Evolving/moderate complexity  EVALUATION COMPLEXITY: Moderate   GOALS:   SHORT TERM GOALS: Target date: 03/10/2024  Devin Marsh will be >75% HEP compliant to improve carryover between sessions and facilitate independent management of condition  Evaluation: ongoing Goal status: MET   LONG TERM GOALS: Target date: 04/07/2024   Devin Marsh will self report >/= 50% decrease in pain from evaluation to improve function in daily tasks  Evaluation/Baseline: 9/10 max pain Goal status:  INITIAL   2.  Devin Marsh will demonstrate >130 degrees of active ROM in flexion to allow completion of activities involving reaching OH, not limited by pain  Evaluation/Baseline: 60 degrees w/ pain Goal status: INITIAL   3.  Devin Marsh will improve the following MMTs to >/= 4/5 to show improvement in strength:     Evaluation/Baseline:   UPPER EXTREMITY MMT:  MMT Right (Eval) Left (Eval)  Shoulder flexion 3-* in available range n  Shoulder abduction (C5) 3-* in available range n  Shoulder ER 3* n  Shoulder IR 3* n  Middle trapezius    Lower trapezius    Shoulder extension    Grip strength    Cervical flexion (C1,C2)    Cervical S/B (C3)    Shoulder shrug (C4)    Elbow flexion (C6)    Elbow ext (C7)    Thumb ext (C8)    Finger abd (T1)    Grossly     (Blank rows = not tested, score listed is out of 5 possible points.  N = WNL, D = diminished, C = clear for gross weakness with myotome testing, * = concordant pain with testing)  Goal status: INITIAL   4.  Devin Marsh will show a >/= 20 pt improvement in his QUICK DASH score (MCID is 10% or ~5 pts) as a proxy for functional improvement   Evaluation/Baseline: 43 pts Goal status: INITIAL   PLAN: PT FREQUENCY: 1-2x/week  PT DURATION: 8 weeks  PLANNED INTERVENTIONS:  97164- PT Re-evaluation, 97110-Therapeutic exercises, 97530- Therapeutic activity, 97112- Neuromuscular re-education, 97535- Self Care, 16109- Manual therapy, [id number]- Gait training, [id number]- Aquatic Therapy, [id number]- Electrical stimulation (manual), [id number]- Vasopneumatic device, [id number]- Traction (mechanical), [id number]- Ionotophoresis 4mg /ml Dexamethasone , Taping, Dry Needling, Joint manipulation, and Spinal manipulation.   Clint Biello PT, DPT 03/13/2024, 10:55 AM

## 2024-03-20 ENCOUNTER — Ambulatory Visit: Admitting: Physical Therapy

## 2024-03-27 ENCOUNTER — Encounter: Payer: Self-pay | Admitting: Physical Therapy

## 2024-03-27 ENCOUNTER — Ambulatory Visit: Payer: Self-pay | Admitting: Physical Therapy

## 2024-03-27 DIAGNOSIS — R600 Localized edema: Secondary | ICD-10-CM

## 2024-03-27 DIAGNOSIS — M25512 Pain in left shoulder: Secondary | ICD-10-CM | POA: Diagnosis not present

## 2024-03-27 DIAGNOSIS — M6281 Muscle weakness (generalized): Secondary | ICD-10-CM

## 2024-03-27 NOTE — Therapy (Signed)
 OUTPATIENT PHYSICAL THERAPY DAILY NOTE   Patient Name: Carder Yin MRN: [id number] DOB:09/12/1986, 38 y.o., male Today's Date: 03/27/2024   PT End of Session - 03/27/24 1026     Visit Number 8    Number of Visits --   1-2x/week   Date for PT Re-Evaluation 04/07/24    Authorization Type MCR - Quick dash    Progress Note Due on Visit 10    PT Start Time 1021    PT Stop Time 1100    PT Time Calculation (min) 39 min               Past Medical History:  Diagnosis Date   Abscess 04/13/2013   Arthritis    GERD (gastroesophageal reflux disease)    Headache    Hypertension    Multiple sclerosis (HCC)    Past Surgical History:  Procedure Laterality Date   APPENDECTOMY     INCISION AND DRAINAGE PERIRECTAL ABSCESS N/A 04/13/2013   Procedure: IRRIGATION AND DEBRIDEMENT PERIRECTAL ABSCESS;  Surgeon: Fran Imus, MD;  Location: [facility] OR;  Service: General;  Laterality: N/A;   SHOULDER ARTHROSCOPY WITH DISTAL CLAVICLE RESECTION Left 01/07/2024   Procedure: LEFT SHOULDER ARTHROSCOPY WITH DISTAL CLAVICLE EXCISION, BURSECTOMY;  Surgeon: Jasmine Mesi, MD;  Location: [facility] OR;  Service: Orthopedics;  Laterality: Left;   TOE SURGERY     WISDOM TOOTH EXTRACTION     Patient Active Problem List   Diagnosis Date Noted   Bursitis of left shoulder 01/19/2024   Arthritis of left acromioclavicular joint 01/19/2024   Multiple sclerosis (HCC) 09/11/2023   Rectal bleeding 06/15/2020   Anal itching 06/15/2020   Non-traumatic rhabdomyolysis    Rhabdomyolysis 05/11/2015   Tobacco abuse 05/11/2015    PCP: Marius Siemens, NP  REFERRING PROVIDER: Jasmine Mesi, MD  THERAPY DIAG:  Left shoulder pain, unspecified chronicity  Muscle weakness  Localized edema  REFERRING DIAG: Arthralgia of left acromioclavicular joint [M25.512], Chronic left shoulder pain [M25.512, G89.29]   Rationale for Evaluation and Treatment:  Rehabilitation  SUBJECTIVE:  PERTINENT PAST HISTORY:   MS (weakness)       PRECAUTIONS: Distal clavicle excision and debridement of anterior superior labrum 3/11  OP Date: 01/07/2024  2 weeks 01/21/2024  4 weeks 02/04/2024  6 weeks 02/18/2024  8 weeks 03/03/2024  10 weeks 03/17/2024  12 weeks 03/31/2024    WEIGHT BEARING RESTRICTIONS Yes see precuations  FALLS:  Has patient fallen in last 6 months? Yes, Number of falls: 1 fall in shower  MOI/History of condition:  Onset date: 3/11  SUBJECTIVE STATEMENT  03/27/2024: Pt reports continued improvement and rates his pain 2/10  EVAL: Hridhaan Scarpino is a 38 y.o. male who presents to clinic with chief complaint of L shoulder pain following shoulder scope and DCE on 3/11.  Pt reports continued pain following surgery.  He was having neck and shoulder pain following surgery.  He feels his mobility is improving but is still limited.   Red flags:  denies   Pain:  Are you having pain? Yes Pain location: UT and L shoulder NPRS scale:  3/10 to 7/10 Aggravating factors: shoulder movement Relieving factors: rest, ice Pain description: sharp and aching Stage: Subacute 24 hour pattern: worse with activity   Occupation: NA  Assistive Device: NA  Hand Dominance: R  Patient Goals/Specific Activities: reduce pain and improve shoulder ROM   OBJECTIVE:   DIAGNOSTIC FINDINGS:   MRI pre surgery  IMPRESSION: 1. Moderate supraspinatus tendinopathy anteriorly. 2. Moderate  degenerative spurring and subcortical marrow edema at the acromioclavicular joint. 3. Mild subacromial subdeltoid bursitis. 4. Mild degenerative chondral thinning in the glenohumeral joint.  GENERAL OBSERVATION: Fwd head rounded shoulders     SENSATION: Light touch: Appears intact   PALPATION: TTP L UT and over incision site  UPPER EXTREMITY AROM:  ROM Right (Eval) Left (Eval)  Shoulder flexion 150 60*  Shoulder abduction 150 60*  Shoulder internal rotation    Shoulder external rotation    Functional IR n  Unable to tolerate  Functional ER n Unable to tolerate  Shoulder extension    Elbow extension    Elbow flexion     (Blank rows = not tested, N = WNL, * = concordant pain with testing)  UPPER EXTREMITY MMT:  MMT Right (Eval) Left (Eval)  Shoulder flexion 3-* in available range n  Shoulder abduction (C5) 3-* in available range n  Shoulder ER 3* n  Shoulder IR 3* n  Middle trapezius    Lower trapezius    Shoulder extension    Grip strength    Cervical flexion (C1,C2)    Cervical S/B (C3)    Shoulder shrug (C4)    Elbow flexion (C6)    Elbow ext (C7)    Thumb ext (C8)    Finger abd (T1)    Grossly     (Blank rows = not tested, score listed is out of 5 possible points.  N = WNL, D = diminished, C = clear for gross weakness with myotome testing, * = concordant pain with testing)  UPPER EXTREMITY PROM:  PROM Right (Eval) Left (Eval)   Shoulder flexion 65*    Shoulder abduction     Shoulder internal rotation     Shoulder external rotation 20*    Functional IR     Functional ER     Shoulder extension     Elbow extension     Elbow flexion      (Blank rows = not tested, N = WNL, * = concordant pain with testing)  SPECIAL TESTS: NA  JOINT MOBILITY TESTING:  NA  PATIENT SURVEYS:  Quick Dash: 43 pts    TODAY'S TREATMENT:   [facility] Adult PT Treatment  03/27/2024:  Therapeutic Exercise: UBE - L1 - 3'/3' Prone chest press with dowel - 3x15 Supine flexion - small range - 3x15 SA circles - 20x CW/CC Standing Er - RTB - 4x5 Pendulums - 2x 20 ea UE ranger - 32'' - flexion and scaption - 15x ea    HOME EXERCISE PROGRAM: Access Code: [email or web address] URL: [email or web address] Date: 03/07/2024 Prepared by: Lesleigh Rash  Exercises - Shoulder Flexion Wall Slide with Towel  - 2 x daily - 7 x weekly - 2-3 sets - 10 reps - Circular Shoulder Pendulum with Table Support  - 1 x daily - 7 x weekly - 3 sets - 10 reps - Flexion-Extension Shoulder Pendulum with  Table Support  - 1 x daily - 7 x weekly - 3 sets - 10 reps - Horizontal Shoulder Pendulum with Table Support  - 1 x daily - 7 x weekly - 3 sets - 10 reps - Supine Shoulder Flexion AAROM with Dowel  - 1 x daily - 7 x weekly - 3 sets - 10 reps  Treatment priorities   Eval        ROM        Gentle shoulder strength  ASSESSMENT:  CLINICAL IMPRESSION:  03/27/2024:  Gustabo tolerated session well with no adverse reaction.  He is having lower pain and was more tolerant to OH movements and light strengthening today.  He will continue HEP with focus on maintaining ROM and keeping pain low.  EVAL:  Pate is a 38 y.o. male who presents to clinic with signs and sxs consistent with L shoulder pain following distal clavicle excision and labral debridement on 3/11.  He will benefit from skilled PT to improve his shoulder ROM, strength, and function to allow completion of daily tasks involving lifting and reaching.  OBJECTIVE IMPAIRMENTS: Pain, L shoulder ROM, L shoulder strength  ACTIVITY LIMITATIONS: reaching, lifting, driving  PERSONAL FACTORS: See medical history and pertinent history   REHAB POTENTIAL: Good  CLINICAL DECISION MAKING: Evolving/moderate complexity  EVALUATION COMPLEXITY: Moderate   GOALS:   SHORT TERM GOALS: Target date: 03/10/2024  Khi will be >75% HEP compliant to improve carryover between sessions and facilitate independent management of condition  Evaluation: ongoing Goal status: MET   LONG TERM GOALS: Target date: 04/07/2024   Dmetrius will self report >/= 50% decrease in pain from evaluation to improve function in daily tasks  Evaluation/Baseline: 9/10 max pain Goal status: INITIAL   2.  Zula Hitch will demonstrate >130 degrees of active ROM in flexion to allow completion of activities involving reaching OH, not limited by pain  Evaluation/Baseline: 60 degrees w/ pain Goal status: INITIAL   3.  Chaunce will  improve the following MMTs to >/= 4/5 to show improvement in strength:     Evaluation/Baseline:   UPPER EXTREMITY MMT:  MMT Right (Eval) Left (Eval)  Shoulder flexion 3-* in available range n  Shoulder abduction (C5) 3-* in available range n  Shoulder ER 3* n  Shoulder IR 3* n  Middle trapezius    Lower trapezius    Shoulder extension    Grip strength    Cervical flexion (C1,C2)    Cervical S/B (C3)    Shoulder shrug (C4)    Elbow flexion (C6)    Elbow ext (C7)    Thumb ext (C8)    Finger abd (T1)    Grossly     (Blank rows = not tested, score listed is out of 5 possible points.  N = WNL, D = diminished, C = clear for gross weakness with myotome testing, * = concordant pain with testing)  Goal status: INITIAL   4.  Gustave will show a >/= 20 pt improvement in his QUICK DASH score (MCID is 10% or ~5 pts) as a proxy for functional improvement   Evaluation/Baseline: 43 pts Goal status: INITIAL   PLAN: PT FREQUENCY: 1-2x/week  PT DURATION: 8 weeks  PLANNED INTERVENTIONS:  97164- PT Re-evaluation, 97110-Therapeutic exercises, 97530- Therapeutic activity, 97112- Neuromuscular re-education, 97535- Self Care, [id number]- Manual therapy, [id number]- Gait training, [id number]- Aquatic Therapy, [id number]- Electrical stimulation (manual), [id number]- Vasopneumatic device, [id number]- Traction (mechanical), [id number]- Ionotophoresis 4mg /ml Dexamethasone , Taping, Dry Needling, Joint manipulation, and Spinal manipulation.   Zebedee Segundo PT, DPT 03/27/2024, 11:09 AM

## 2024-04-03 ENCOUNTER — Ambulatory Visit: Payer: Self-pay | Attending: Orthopedic Surgery | Admitting: Physical Therapy

## 2024-04-03 ENCOUNTER — Encounter: Payer: Self-pay | Admitting: Physical Therapy

## 2024-04-03 DIAGNOSIS — R600 Localized edema: Secondary | ICD-10-CM | POA: Insufficient documentation

## 2024-04-03 DIAGNOSIS — M6281 Muscle weakness (generalized): Secondary | ICD-10-CM | POA: Insufficient documentation

## 2024-04-03 DIAGNOSIS — M25512 Pain in left shoulder: Secondary | ICD-10-CM | POA: Diagnosis present

## 2024-04-03 NOTE — Therapy (Signed)
 OUTPATIENT PHYSICAL THERAPY DAILY NOTE   Patient Name: Devin Marsh MRN: [id number] DOB:Mar 29, 1986, 38 y.o., male Today's Date: 04/03/2024   PT End of Session - 04/03/24 1116     Visit Number 9    Number of Visits --   1-2x/week   Date for PT Re-Evaluation 04/07/24    Authorization Type MCR - Quick dash    Progress Note Due on Visit 10    PT Start Time 1115   pt arrived late   PT Stop Time 1142   + vaso   PT Time Calculation (min) 27 min               Past Medical History:  Diagnosis Date   Abscess 04/13/2013   Arthritis    GERD (gastroesophageal reflux disease)    Headache    Hypertension    Multiple sclerosis (HCC)    Past Surgical History:  Procedure Laterality Date   APPENDECTOMY     INCISION AND DRAINAGE PERIRECTAL ABSCESS N/A 04/13/2013   Procedure: IRRIGATION AND DEBRIDEMENT PERIRECTAL ABSCESS;  Surgeon: Fran Imus, MD;  Location: [facility] OR;  Service: General;  Laterality: N/A;   SHOULDER ARTHROSCOPY WITH DISTAL CLAVICLE RESECTION Left 01/07/2024   Procedure: LEFT SHOULDER ARTHROSCOPY WITH DISTAL CLAVICLE EXCISION, BURSECTOMY;  Surgeon: Jasmine Mesi, MD;  Location: [facility] OR;  Service: Orthopedics;  Laterality: Left;   TOE SURGERY     WISDOM TOOTH EXTRACTION     Patient Active Problem List   Diagnosis Date Noted   Bursitis of left shoulder 01/19/2024   Arthritis of left acromioclavicular joint 01/19/2024   Multiple sclerosis (HCC) 09/11/2023   Rectal bleeding 06/15/2020   Anal itching 06/15/2020   Non-traumatic rhabdomyolysis    Rhabdomyolysis 05/11/2015   Tobacco abuse 05/11/2015    PCP: Marius Siemens, NP  REFERRING PROVIDER: Marius Siemens, NP  THERAPY DIAG:  Left shoulder pain, unspecified chronicity  Muscle weakness  Localized edema  REFERRING DIAG: Arthralgia of left acromioclavicular joint [M25.512], Chronic left shoulder pain [M25.512, G89.29]   Rationale for Evaluation and Treatment:   Rehabilitation  SUBJECTIVE:  PERTINENT PAST HISTORY:  MS (weakness)       PRECAUTIONS: Distal clavicle excision and debridement of anterior superior labrum 3/11  OP Date: 01/07/2024  2 weeks 01/21/2024  4 weeks 02/04/2024  6 weeks 02/18/2024  8 weeks 03/03/2024  10 weeks 03/17/2024  12 weeks 03/31/2024    WEIGHT BEARING RESTRICTIONS Yes see precuations  FALLS:  Has patient fallen in last 6 months? Yes, Number of falls: 1 fall in shower  MOI/History of condition:  Onset date: 3/11  SUBJECTIVE STATEMENT  04/03/2024: Pt reports he continues to see some improvement in his shoulder pain.    EVAL: Devin Marsh is a 38 y.o. male who presents to clinic with chief complaint of L shoulder pain following shoulder scope and DCE on 3/11.  Pt reports continued pain following surgery.  He was having neck and shoulder pain following surgery.  He feels his mobility is improving but is still limited.   Red flags:  denies   Pain:  Are you having pain? Yes Pain location: UT and L shoulder NPRS scale:  3/10 to 7/10 Aggravating factors: shoulder movement Relieving factors: rest, ice Pain description: sharp and aching Stage: Subacute 24 hour pattern: worse with activity   Occupation: NA  Assistive Device: NA  Hand Dominance: R  Patient Goals/Specific Activities: reduce pain and improve shoulder ROM   OBJECTIVE:   DIAGNOSTIC FINDINGS:  MRI pre surgery  IMPRESSION: 1. Moderate supraspinatus tendinopathy anteriorly. 2. Moderate degenerative spurring and subcortical marrow edema at the acromioclavicular joint. 3. Mild subacromial subdeltoid bursitis. 4. Mild degenerative chondral thinning in the glenohumeral joint.  GENERAL OBSERVATION: Fwd head rounded shoulders     SENSATION: Light touch: Appears intact   PALPATION: TTP L UT and over incision site  UPPER EXTREMITY AROM:  ROM Right (Eval) Left (Eval)  Shoulder flexion 150 60*  Shoulder abduction 150 60*  Shoulder  internal rotation    Shoulder external rotation    Functional IR n Unable to tolerate  Functional ER n Unable to tolerate  Shoulder extension    Elbow extension    Elbow flexion     (Blank rows = not tested, N = WNL, * = concordant pain with testing)  UPPER EXTREMITY MMT:  MMT Right (Eval) Left (Eval)  Shoulder flexion 3-* in available range n  Shoulder abduction (C5) 3-* in available range n  Shoulder ER 3* n  Shoulder IR 3* n  Middle trapezius    Lower trapezius    Shoulder extension    Grip strength    Cervical flexion (C1,C2)    Cervical S/B (C3)    Shoulder shrug (C4)    Elbow flexion (C6)    Elbow ext (C7)    Thumb ext (C8)    Finger abd (T1)    Grossly     (Blank rows = not tested, score listed is out of 5 possible points.  N = WNL, D = diminished, C = clear for gross weakness with myotome testing, * = concordant pain with testing)  UPPER EXTREMITY PROM:  PROM Right (Eval) Left (Eval)   Shoulder flexion 65*    Shoulder abduction     Shoulder internal rotation     Shoulder external rotation 20*    Functional IR     Functional ER     Shoulder extension     Elbow extension     Elbow flexion      (Blank rows = not tested, N = WNL, * = concordant pain with testing)  SPECIAL TESTS: NA  JOINT MOBILITY TESTING:  NA  PATIENT SURVEYS:  Quick Dash: 43 pts    TODAY'S TREATMENT:   [facility] Adult PT Treatment  04/03/2024:  Therapeutic Exercise: UBE - L1 - 3'/3' Prone chest press with dowel - 2x10 Supine flexion - small range - 2x10 SA circles - 20x CW/CC  Therapeutic Activity  collecting information for goals, checking progress, and reviewing with patient  Modalities:  Vasopneumatic (Game Ready)    Location:  left shoulder Time:  10 minutes Pressure:  low Temperature:  38 degrees    HOME EXERCISE PROGRAM: Access Code: [email or web address] URL: [email or web address] Date: 03/07/2024 Prepared by: Lesleigh Rash  Exercises - Shoulder  Flexion Wall Slide with Towel  - 2 x daily - 7 x weekly - 2-3 sets - 10 reps - Circular Shoulder Pendulum with Table Support  - 1 x daily - 7 x weekly - 3 sets - 10 reps - Flexion-Extension Shoulder Pendulum with Table Support  - 1 x daily - 7 x weekly - 3 sets - 10 reps - Horizontal Shoulder Pendulum with Table Support  - 1 x daily - 7 x weekly - 3 sets - 10 reps - Supine Shoulder Flexion AAROM with Dowel  - 1 x daily - 7 x weekly - 3 sets - 10 reps  Treatment priorities   Eval  ROM        Gentle shoulder strength                                  ASSESSMENT:  CLINICAL IMPRESSION:  04/03/2024: Upon goal recheck Devin Marsh has made good progress toward most long term goals with the exception of flexion and abd strength.  We have mainly focused on gentle ROM and strengthening in neutral.  At this point Devin Marsh has full PROM and functional AROM but continues to be limited in higher level activities d/t pain, particularly when reaching past 90 degrees.  He has not tolerated more than basic strength progression d/t pain.  We will hold PT for now and pt will follow up with surgeon's office in about 1 week and will discuss next steps he can return as needed.  EVAL:  Devin Marsh is a 38 y.o. male who presents to clinic with signs and sxs consistent with L shoulder pain following distal clavicle excision and labral debridement on 3/11.  He will benefit from skilled PT to improve his shoulder ROM, strength, and function to allow completion of daily tasks involving lifting and reaching.  OBJECTIVE IMPAIRMENTS: Pain, L shoulder ROM, L shoulder strength  ACTIVITY LIMITATIONS: reaching, lifting, driving  PERSONAL FACTORS: See medical history and pertinent history   REHAB POTENTIAL: Good  CLINICAL DECISION MAKING: Evolving/moderate complexity  EVALUATION COMPLEXITY: Moderate   GOALS:   SHORT TERM GOALS: Target date: 03/10/2024  Devin Marsh will be >75% HEP compliant to improve carryover  between sessions and facilitate independent management of condition  Evaluation: ongoing Goal status: MET   LONG TERM GOALS: Target date: 04/07/2024   Devin Marsh will self report >/= 50% decrease in pain from evaluation to improve function in daily tasks  Evaluation/Baseline: 9/10 max pain 6/6: 65% Goal status: MET   2.  Devin Marsh will demonstrate >130 degrees of active ROM in flexion to allow completion of activities involving reaching OH, not limited by pain  Evaluation/Baseline: 60 degrees w/ pain 6/6: 130 w/ pain Goal status: Partially MET   3.  Devin Marsh will improve the following MMTs to >/= 4/5 to show improvement in strength:     Evaluation/Baseline:   UPPER EXTREMITY MMT:  MMT Right (Eval) Left (Eval) R  Shoulder flexion 3-* in available range n 3  Shoulder abduction (C5) 3-* in available range n 3  Shoulder ER 3* n 4+  Shoulder IR 3* n 4+  Middle trapezius     Lower trapezius     Shoulder extension     Grip strength     Cervical flexion (C1,C2)     Cervical S/B (C3)     Shoulder shrug (C4)     Elbow flexion (C6)     Elbow ext (C7)     Thumb ext (C8)     Finger abd (T1)     Grossly      (Blank rows = not tested, score listed is out of 5 possible points.  N = WNL, D = diminished, C = clear for gross weakness with myotome testing, * = concordant pain with testing)  Goal status: PARTIALLY MET   4.  Devin Marsh will show a >/= 20 pt improvement in his QUICK DASH score (MCID is 10% or ~5 pts) as a proxy for functional improvement   Evaluation/Baseline: 43 pts 6/6: QuickDASH Score: 54.5 / 100 = 54.5 % Goal status: Ongoing    PLAN: PT FREQUENCY: 1-2x/week  PT DURATION: 8 weeks  PLANNED INTERVENTIONS:  97164- PT Re-evaluation, 97110-Therapeutic exercises, 97530- Therapeutic activity, [id number]- Neuromuscular re-education, 97535- Self Care, [id number]- Manual therapy, [id number]- Gait training, [id number]- Aquatic Therapy, [phone]- Electrical stimulation (manual), [id number]-  Vasopneumatic device, [id number]- Traction (mechanical), [id number]- Ionotophoresis 4mg /ml Dexamethasone , Taping, Dry Needling, Joint manipulation, and Spinal manipulation.   Lesleigh Rash PT, DPT 04/03/2024, 11:59 AM

## 2024-04-15 ENCOUNTER — Ambulatory Visit: Admitting: Surgical

## 2024-04-16 ENCOUNTER — Ambulatory Visit: Admitting: Surgical

## 2024-04-21 ENCOUNTER — Telehealth (INDEPENDENT_AMBULATORY_CARE_PROVIDER_SITE_OTHER): Payer: Self-pay | Admitting: Primary Care

## 2024-04-21 NOTE — Telephone Encounter (Signed)
 Left VM with pt about their upcoming appt.

## 2024-04-28 ENCOUNTER — Encounter (INDEPENDENT_AMBULATORY_CARE_PROVIDER_SITE_OTHER): Payer: Self-pay | Admitting: Primary Care

## 2024-04-28 ENCOUNTER — Ambulatory Visit (INDEPENDENT_AMBULATORY_CARE_PROVIDER_SITE_OTHER): Admitting: Primary Care

## 2024-04-28 VITALS — BP 131/87 | HR 76 | Resp 16 | Wt 281.8 lb

## 2024-04-28 DIAGNOSIS — I10 Essential (primary) hypertension: Secondary | ICD-10-CM

## 2024-04-28 DIAGNOSIS — N521 Erectile dysfunction due to diseases classified elsewhere: Secondary | ICD-10-CM | POA: Diagnosis not present

## 2024-04-28 DIAGNOSIS — H1032 Unspecified acute conjunctivitis, left eye: Secondary | ICD-10-CM

## 2024-04-28 DIAGNOSIS — Z23 Encounter for immunization: Secondary | ICD-10-CM | POA: Diagnosis not present

## 2024-04-28 MED ORDER — TADALAFIL 10 MG PO TABS: 10.0000 mg | ORAL_TABLET | ORAL | 1 refills | Status: DC | PRN

## 2024-04-28 MED ORDER — CROMOLYN SODIUM 4 % OP SOLN: 1.0000 [drp] | Freq: Every day | OPHTHALMIC | 0 refills | Status: DC

## 2024-04-28 MED ORDER — CETIRIZINE HCL 10 MG PO TABS
10.0000 mg | ORAL_TABLET | Freq: Every day | ORAL | 0 refills | Status: AC
Start: 2024-04-28 — End: ?

## 2024-04-28 NOTE — Progress Notes (Signed)
 [facility]   Devin Marsh is a 38 y.o. male presents for hypertension evaluation, Denies shortness of breath, headaches, chest pain or lower extremity edema, sudden onset, vision changes, unilateral weakness, dizziness, paresthesias   Past Medical History:  Diagnosis Date   Abscess 04/13/2013   Arthritis    GERD (gastroesophageal reflux disease)    Headache    Hypertension    Multiple sclerosis (HCC)    Past Surgical History:  Procedure Laterality Date   APPENDECTOMY     INCISION AND DRAINAGE PERIRECTAL ABSCESS N/A 04/13/2013   Procedure: IRRIGATION AND DEBRIDEMENT PERIRECTAL ABSCESS;  Surgeon: Camellia CHRISTELLA Blush, MD;  Location: [facility] OR;  Service: General;  Laterality: N/A;   SHOULDER ARTHROSCOPY WITH DISTAL CLAVICLE RESECTION Left 01/07/2024   Procedure: LEFT SHOULDER ARTHROSCOPY WITH DISTAL CLAVICLE EXCISION, BURSECTOMY;  Surgeon: Addie Cordella Hamilton, MD;  Location: [facility] OR;  Service: Orthopedics;  Laterality: Left;   TOE SURGERY     WISDOM TOOTH EXTRACTION     No Known Allergies Current Outpatient Medications on File Prior to Visit  Medication Sig Dispense Refill   acetaminophen  (TYLENOL ) 500 MG tablet Take 500-1,000 mg by mouth every 6 (six) hours as needed (pain.).     celecoxib  (CELEBREX ) 100 MG capsule TAKE 1 CAPSULE(100 MG) BY MOUTH TWICE DAILY 60 capsule 0   gabapentin (NEURONTIN) 100 MG capsule Take 100 mg by mouth daily as needed (pain.).     methocarbamol  (ROBAXIN ) 500 MG tablet Take 1 tablet (500 mg total) by mouth every 8 (eight) hours as needed for muscle spasms. 30 tablet 1   Ofatumumab (KESIMPTA) 20 MG/0.4ML SOAJ Inject 20 mg into the skin every 30 (thirty) days.     oxyCODONE  (ROXICODONE ) 5 MG immediate release tablet Take 1 tablet (5 mg total) by mouth every 8 (eight) hours as needed for severe pain (pain score 7-10). 30 tablet 0   promethazine  (PHENERGAN ) 25 MG suppository Place 1 suppository (25 mg total) rectally every 6 (six) hours as needed for  nausea or vomiting. 12 each 0   propranolol ER (INDERAL LA) 80 MG 24 hr capsule Take 80 mg by mouth daily.     tadalafil  (CIALIS ) 5 MG tablet Take 5 mg by mouth daily as needed for erectile dysfunction.     No current facility-administered medications on file prior to visit.   Social History   Socioeconomic History   Marital status: Single    Spouse name: Not on file   Number of children: 2   Years of education: Not on file   Highest education level: Not on file  Occupational History   Not on file  Tobacco Use   Smoking status: Former    Current packs/day: 0.00    Types: Cigarettes    Quit date: 10/29/2017    Years since quitting: 6.5   Smokeless tobacco: Never  Vaping Use   Vaping status: Some Days  Substance and Sexual Activity   Alcohol use: No   Drug use: No   Sexual activity: Yes  Other Topics Concern   Not on file  Social History Narrative   Not on file   Social Drivers of Health   Financial Resource Strain: Not on file  Food Insecurity: Low Risk  (06/19/2023)   Received from [facility]   Hunger Vital Sign    Within the past 12 months, you worried that your food would run out before you got money to buy more: Never true    Within the past 12 months,  the food you bought just didn't last and you didn't have money to get more. : Never true  Transportation Needs: No Transportation Needs (06/19/2023)   Received from [facility]   Transportation    In the past 12 months, has lack of reliable transportation kept you from medical appointments, meetings, work or from getting things needed for daily living? : No  Physical Activity: Not on file  Stress: Not on file  Social Connections: Unknown (03/07/2022)   Received from [facility]   Social Network    Social Network: Not on file  Intimate Partner Violence: Unknown (01/31/2022)   Received from [facility]   HITS    Physically Hurt: Not on file    Insult or Talk Down To: Not on file    Threaten Physical Harm: Not  on file    Scream or Curse: Not on file   Family History  Problem Relation Age of Onset   Diabetes Mother    Asthma Brother    Colon cancer Neg Hx    Esophageal cancer Neg Hx    Rectal cancer Neg Hx    Stomach cancer Neg Hx    Health Maintenance  Topic Date Due   Medicare Annual Wellness (AWV)  Never done   Hepatitis B Vaccines (1 of 3 - 19+ 3-dose series) Never done   HPV VACCINES (1 - 3-dose SCDM series) Never done   COVID-19 Vaccine (1 - 2024-25 season) Never done   INFLUENZA VACCINE  05/29/2024   DTaP/Tdap/Td (2 - Td or Tdap) 05/30/2030   Hepatitis C Screening  Completed   HIV Screening  Completed   Meningococcal B Vaccine  Aged Out     OBJECTIVE:  Vitals:   04/28/24 0841  BP: 131/87  Pulse: 76  Resp: 16  SpO2: 98%  Weight: 281 lb 12.8 oz (127.8 kg)    Physical Exam Vitals reviewed.  Constitutional:      Appearance: He is obese.  HENT:     Head: Normocephalic.     Right Ear: Tympanic membrane and external ear normal.     Left Ear: Tympanic membrane and external ear normal.     Nose: Nose normal.   Eyes:     General:        Right eye: Discharge present.     Extraocular Movements: Extraocular movements intact.     Pupils: Pupils are equal, round, and reactive to light.    Cardiovascular:     Rate and Rhythm: Normal rate and regular rhythm.  Pulmonary:     Effort: Pulmonary effort is normal.     Breath sounds: Normal breath sounds.  Abdominal:     General: Bowel sounds are normal. There is distension.     Palpations: Abdomen is soft.   Musculoskeletal:        General: Normal range of motion.   Skin:    General: Skin is warm and dry.   Neurological:     Mental Status: He is oriented to person, place, and time.   Psychiatric:        Mood and Affect: Mood normal.        Behavior: Behavior normal.        Thought Content: Thought content normal.        Judgment: Judgment normal.      Review of Systems  All other systems reviewed and are  negative.   Last 3 Office BP readings: BP Readings from Last 3 Encounters:  04/28/24 131/87  01/28/24 ROLLEN)  137/95  01/08/24 (!) 145/91    BMET    Component Value Date/Time   NA 136 01/08/2024 0556   NA 138 05/30/2020 1136   K 4.0 01/08/2024 0556   CL 103 01/08/2024 0556   CO2 23 01/08/2024 0556   GLUCOSE 129 (H) 01/08/2024 0556   BUN 10 01/08/2024 0556   BUN 7 05/30/2020 1136   CREATININE 1.26 (H) 01/08/2024 0556   CALCIUM  10.2 01/08/2024 0556   GFRNONAA >60 01/08/2024 0556   GFRAA 110 05/30/2020 1136    Renal function: CrCl cannot be calculated (Patient's most recent lab result is older than the maximum 21 days allowed.).  Clinical ASCVD: No  The ASCVD Risk score (Arnett DK, et al., 2019) failed to calculate for the following reasons:   Cannot find a previous HDL lab   Cannot find a previous total cholesterol lab  ASCVD risk factors include- [place]   ASSESSMENT & PLAN: Christino was seen today for blood pressure check and eye problem.  Diagnoses and all orders for this visit:   Acute bacterial conjunctivitis of left eye -     cromolyn  (OPTICROM ) 4 % ophthalmic solution; Place 1 drop into the left eye 5 (five) times daily.  Erectile dysfunction due to diseases classified elsewhere -     Discontinue: tadalafil  (CIALIS ) 10 MG tablet; Take 1 tablet (10 mg total) by mouth every other day as needed for erectile dysfunction.  Other orders -     tadalafil  (CIALIS ) 10 MG tablet; Take 1 tablet (10 mg total) by mouth every other day as needed for erectile dysfunction. -     cetirizine  (ZYRTEC  ALLERGY) 10 MG tablet; Take 1 tablet (10 mg total) by mouth daily. -     HPV 9-valent vaccine,Recombinat    Essential hypertension -Counseled on lifestyle modifications for blood pressure control including reduced dietary sodium, increased exercise, weight reduction and adequate sleep. Also, educated patient about the risk for cardiovascular events, stroke and heart attack. Also  counseled patient about the importance of medication adherence. If you participate in smoking, it is important to stop using tobacco as this will increase the risks associated with uncontrolled blood pressure.    This note has been created with Education officer, environmental. Any transcriptional errors are unintentional.   [name], NP 04/28/2024, 9:02 AM

## 2024-04-28 NOTE — Patient Instructions (Addendum)
 Preventing Hypertension Hypertension, also called high blood pressure, is when the force of blood pumping through the arteries is too strong. Arteries are blood vessels that carry blood from the heart throughout the body. Often, hypertension does not cause symptoms until blood pressure is very high. It is important to have your blood pressure checked regularly. Diet and lifestyle changes can help you prevent hypertension, and they may make you feel better overall and improve your quality of life. If you already have hypertension, you may control it with diet and lifestyle changes, as well as with medicine. How can this condition affect me? Over time, hypertension can damage the arteries and decrease blood flow to important parts of the body, including the brain, heart, and kidneys. By keeping your blood pressure in a healthy range, you can help prevent complications like heart attack, heart failure, stroke, kidney failure, and vascular dementia. What can increase my risk? An unhealthy diet and a lack of physical activity can make you more likely to develop high blood pressure. Some other risk factors include: Age. The risk increases with age. Having family members who have had high blood pressure. Having certain health conditions, such as thyroid problems. Being overweight or obese. Drinking too much alcohol or caffeine. Having too much fat, sugar, calories, or salt (sodium) in your diet. Smoking or using illegal drugs. Taking certain medicines, such as antidepressants, decongestants, birth control pills, and NSAIDs, such as ibuprofen . What actions can I take to prevent or manage this condition? Work with your health care provider to make a hypertension prevention plan that works for you. You may be referred for counseling on a healthy diet and physical activity. Follow your plan and keep all follow-up visits. Diet changes Maintain a healthy diet. This includes: Eating less salt (sodium). Ask your  health care provider how much sodium is safe for you to have. The general recommendation is to have less than 1 tsp (2,300 mg) of sodium a day. Do not add salt to your food. Choose low-sodium options when grocery shopping and eating out. Limiting fats in your diet. You can do this by eating low-fat or fat-free dairy products and by eating less red meat. Eating more fruits, vegetables, and whole grains. Make a goal to eat: 1-2 cups of fresh fruits and vegetables each day. 3-4 servings of whole grains each day. Avoiding foods and beverages that have added sugars. Eating fish that contain healthy fats (omega-3 fatty acids), such as mackerel or salmon. If you need help putting together a healthy eating plan, try the DASH diet. This diet is high in fruits, vegetables, and whole grains. It is low in sodium, red meat, and added sugars. DASH stands for Dietary Approaches to Stop Hypertension. Lifestyle changes  Lose weight if you are overweight. Losing just 3-5% of your body weight can help prevent or control hypertension. For example, if your present weight is 200 lb (91 kg), a loss of 3-5% of your weight means losing 6-10 lb (2.7-4.5 kg). Ask your health care provider to help you with a diet and exercise plan to safely lose weight. Get enough exercise. Do at least 150 minutes of moderate-intensity exercise each week. You could do this in short exercise sessions several times a day, or you could do longer exercise sessions a few times a week. For example, you could take a brisk 10-minute walk or bike ride, 3 times a day, for 5 days a week. Find ways to reduce stress, such as exercising, meditating, listening to  music, or taking a yoga class. If you need help reducing stress, ask your health care provider. Do not use any products that contain nicotine  or tobacco. These products include cigarettes, chewing tobacco, and vaping devices, such as e-cigarettes. Chemicals in tobacco and nicotine  products raise your  blood pressure each time you use them. If you need help quitting, ask your health care provider. Learn how to check your blood pressure at home. Make sure that you know your personal target blood pressure, as told by your health care provider. Try to sleep 7-9 hours per night. Alcohol use Do not drink alcohol if: Your health care provider tells you not to drink. You are pregnant, may be pregnant, or are planning to become pregnant. If you drink alcohol: Limit how much you have to: 0-1 drink a day for women. 0-2 drinks a day for men. Know how much alcohol is in your drink. In the [place] one drink equals one 12 oz bottle of beer (355 mL), one 5 oz glass of wine (148 mL), or one 1 oz glass of hard liquor (44 mL). Medicines In addition to diet and lifestyle changes, your health care provider may recommend medicines to help lower your blood pressure. In general: You may need to try a few different medicines to find what works best for you. You may need to take more than one medicine. Take over-the-counter and prescription medicines only as told by your health care provider. Questions to ask your health care provider What is my blood pressure goal? How can I lower my risk for high blood pressure? How should I monitor my blood pressure at home? Where to find support Your health care provider can help you prevent hypertension and help you keep your blood pressure at a healthy level. Your local hospital or your community may also provide support services and prevention programs. The American Heart Association offers an online support network at supportnetwork.heart.org Where to find more information Learn more about hypertension from: National Heart, Lung, and Blood Institute: PopSteam.is Centers for Disease Control and Prevention: FootballExhibition.com.br American Academy of Family Physicians: familydoctor.org Learn more about the DASH diet from: National Heart, Lung, and Blood Institute:  PopSteam.is Contact a health care provider if: You think you are having a reaction to medicines you have taken. You have recurrent headaches or feel dizzy. You have swelling in your ankles. You have trouble with your vision. Get help right away if: You have sudden, severe chest, back, or abdominal pain or discomfort. You have shortness of breath. You have a sudden, severe headache. These symptoms may be an emergency. Get help right away. Call 911. Do not wait to see if the symptoms will go away. Do not drive yourself to the hospital. Summary Hypertension often does not cause any symptoms until blood pressure is very high. It is important to get your blood pressure checked regularly. Diet and lifestyle changes are important steps in preventing hypertension. By keeping your blood pressure in a healthy range, you may prevent complications like heart attack, heart failure, stroke, and kidney failure. Work with your health care provider to make a hypertension prevention plan that works for you. This information is not intended to replace advice given to you by your health care provider. Make sure you discuss any questions you have with your health care provider. Document Revised: 08/03/2021 Document Reviewed: 08/03/2021 Elsevier Patient Education  2024 Elsevier Inc.Human Papillomavirus (HPV) Vaccine Injection What is this medication? HUMAN PAPILLOMAVIRUS VACCINE (HYOO muhn pap uh LOH muh  vahy ruhs vak SEEN) reduces the risk of human papillomavirus (HPV). It does not treat HPV. It is still possible to get HPV after receiving this vaccine, but the symptoms may be less severe or not last as long. It works by helping your immune system learn how to fight off a future infection. This medicine may be used for other purposes; ask your health care provider or pharmacist if you have questions. COMMON BRAND NAME(S): Gardasil 9 What should I tell my care team before I take this medication? They need  to know if you have any of these conditions: Fever Hemophilia HIV or AIDS Immune system problems Infection Low platelets An unusual reaction to human papillomavirus vaccine, yeast, other vaccines, other medications, foods, dyes, or preservatives Pregnant or trying to get pregnant Breastfeeding How should I use this medication? This vaccine is injected into a muscle. It is given by your care team. This vaccine requires 2 or 3 doses to get the full benefit. Set a reminder for when your next dose is due. A copy of the Vaccine Information Statement will be given before each vaccination. Be sure to read this information carefully each time. This sheet may change often. Talk to your care team about the use of this medication in children. While it may be prescribed for children as young as 9 years for selected conditions, precautions do apply. Overdosage: If you think you have taken too much of this medicine contact a poison control center or emergency room at once. NOTE: This medicine is only for you. Do not share this medicine with others. What if I miss a dose? Keep appointments for follow-up doses as directed. It is important not to miss your dose. Call your care team if you are unable to keep an appointment. What may interact with this medication? Certain medications for arthritis Medications for organ transplant Medications to treat cancer Steroid medications, such as prednisone or cortisone This list may not describe all possible interactions. Give your health care provider a list of all the medicines, herbs, non-prescription drugs, or dietary supplements you use. Also tell them if you smoke, drink alcohol, or use illegal drugs. Some items may interact with your medicine. What should I watch for while using this medication? Visit your care team regularly. Report any side effects to your care team right away. This vaccine, like all vaccines, may not fully protect everyone. What side  effects may I notice from receiving this medication? Side effects that you should report to your care team as soon as possible: Allergic reactions--skin rash, itching, hives, swelling of the face, lips, tongue, or throat Feeling faint or lightheaded Side effects that usually do not require medical attention (report these to your care team if they continue or are bothersome): Diarrhea Dizziness Fatigue Fever Headache Nausea Pain, redness, irritation, or bruising at the injection site This list may not describe all possible side effects. Call your doctor for medical advice about side effects. You may report side effects to FDA at 1-800-FDA-1088. Where should I keep my medication? This vaccine is only given by your care team. It will not be stored at home. NOTE: This sheet is a summary. It may not cover all possible information. If you have questions about this medicine, talk to your doctor, pharmacist, or health care provider.  2024 Elsevier/Gold Standard (2022-03-28 00:00:00)

## 2024-04-30 ENCOUNTER — Ambulatory Visit: Admitting: Surgical

## 2024-05-15 ENCOUNTER — Ambulatory Visit: Admitting: Surgical

## 2024-06-05 ENCOUNTER — Encounter (INDEPENDENT_AMBULATORY_CARE_PROVIDER_SITE_OTHER)

## 2024-06-11 ENCOUNTER — Encounter (INDEPENDENT_AMBULATORY_CARE_PROVIDER_SITE_OTHER)

## 2024-06-19 ENCOUNTER — Encounter (INDEPENDENT_AMBULATORY_CARE_PROVIDER_SITE_OTHER)

## 2024-08-31 ENCOUNTER — Encounter: Payer: Self-pay | Admitting: Radiology

## 2024-10-21 ENCOUNTER — Telehealth (INDEPENDENT_AMBULATORY_CARE_PROVIDER_SITE_OTHER): Payer: Self-pay | Admitting: Primary Care

## 2024-10-21 NOTE — Telephone Encounter (Signed)
 Called pt to confirm appt.Pt did not answer and could not LVM.

## 2024-10-23 ENCOUNTER — Ambulatory Visit (INDEPENDENT_AMBULATORY_CARE_PROVIDER_SITE_OTHER): Admitting: Primary Care

## 2024-11-11 ENCOUNTER — Ambulatory Visit (INDEPENDENT_AMBULATORY_CARE_PROVIDER_SITE_OTHER): Admitting: Primary Care

## 2024-12-02 ENCOUNTER — Encounter (INDEPENDENT_AMBULATORY_CARE_PROVIDER_SITE_OTHER): Payer: Self-pay | Admitting: Primary Care

## 2024-12-02 ENCOUNTER — Telehealth: Payer: Self-pay

## 2024-12-02 ENCOUNTER — Ambulatory Visit (INDEPENDENT_AMBULATORY_CARE_PROVIDER_SITE_OTHER): Admitting: Primary Care

## 2024-12-02 VITALS — BP 123/85 | HR 72 | Temp 98.2°F | Resp 16 | Ht 69.0 in | Wt 274.6 lb

## 2024-12-02 DIAGNOSIS — Z113 Encounter for screening for infections with a predominantly sexual mode of transmission: Secondary | ICD-10-CM

## 2024-12-02 DIAGNOSIS — N521 Erectile dysfunction due to diseases classified elsewhere: Secondary | ICD-10-CM

## 2024-12-02 MED ORDER — TADALAFIL 10 MG PO TABS: 10.0000 mg | ORAL_TABLET | ORAL | 1 refills | Status: AC | PRN

## 2024-12-02 NOTE — Progress Notes (Unsigned)
 " [facility]  Devin Marsh, is a 39 y.o. male  RDW:[id number]  FMW:[id number]  DOB - Oct 17, 1986  No chief complaint on file.      Subjective:   Devin Marsh is a 38 y.o. male here today for a follow up visit. Htn-Patient has No , No chest pain, No abdominal pain - No Nausea, No new weakness tingling or numbness, No Cough - shortness of breath Neurology changed migraines/headache medication and now headaches are more consistent - explain body has to adjust but sent neurologist his concern. 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  Does not check blood sugars at home./s HPI  No problems updated.  Comprehensive ROS Pertinent positive and negative noted in HPI   Allergies[1]  Past Medical History:  Diagnosis Date   Abscess 04/13/2013   Arthritis    GERD (gastroesophageal reflux disease)    Headache    Hypertension    Multiple sclerosis     Medications Ordered Prior to Encounter[2] Health Maintenance  Topic Date Due   Medicare Annual Wellness Visit  Never done   Hepatitis B Vaccine (2 of 3 - 19+ 3-dose series) 09/23/2008   HPV Vaccine (2 - Risk male 3-dose series) 05/26/2024   Flu Shot  Never done   COVID-19 Vaccine (1 - 2025-26 season) Never done   DTaP/Tdap/Td vaccine (2 - Td or Tdap) 05/30/2030   Hepatitis C Screening  Completed   HIV Screening  Completed   Pneumococcal Vaccine  Aged Out   Meningitis B Vaccine  Aged Out    Objective:  There were no vitals filed for this visit. {Vitals History (Optional):23777}  Physical Exam Vitals reviewed.  Constitutional:      Appearance: He is normal weight.  HENT:     Head: Normocephalic.     Right Ear: Tympanic membrane and external ear normal.     Left Ear: Tympanic membrane and external ear normal.     Nose: Nose normal.  Eyes:     Extraocular Movements: Extraocular movements intact.     Pupils: Pupils are equal, round, and reactive to light.  Cardiovascular:     Rate  and Rhythm: Normal rate and regular rhythm.  Pulmonary:     Effort: Pulmonary effort is normal.     Breath sounds: Normal breath sounds.  Abdominal:     General: Bowel sounds are normal. There is distension.     Palpations: Abdomen is soft.  Musculoskeletal:        General: Normal range of motion.  Skin:    General: Skin is warm and dry.  Neurological:     Mental Status: He is alert and oriented to person, place, and time.  Psychiatric:        Mood and Affect: Mood normal.        Behavior: Behavior normal.        Thought Content: Thought content normal.        Judgment: Judgment normal.     {Labs (Optional):23779}  Assessment & Plan  There are no diagnoses linked to this encounter.   Patient have been counseled extensively about nutrition and exercise. Other issues discussed during this visit include: low cholesterol diet, weight control and daily exercise, foot care, annual eye examinations at [facility], importance of adherence with medications and regular follow-up. We also discussed long term complications of uncontrolled diabetes and hypertension.   No follow-ups on file.  The patient was given clear instructions to go to ER or return to medical center if  symptoms don't improve, worsen or new problems develop. The patient verbalized understanding. The patient was told to call to get lab results if they haven't heard anything in the next week.   This note has been created with Education officer, environmental. Any transcriptional errors are unintentional.   Devin SHAUNNA Bohr, NP 12/02/2024, 9:48 AM     [1] No Known Allergies [2]  Current Outpatient Medications on File Prior to Visit  Medication Sig Dispense Refill   acetaminophen  (TYLENOL ) 500 MG tablet Take 500-1,000 mg by mouth every 6 (six) hours as needed (pain.).     celecoxib  (CELEBREX ) 100 MG capsule TAKE 1 CAPSULE(100 MG) BY MOUTH TWICE DAILY 60 capsule 0   cetirizine  (ZYRTEC   ALLERGY) 10 MG tablet Take 1 tablet (10 mg total) by mouth daily. 90 tablet 0   cromolyn  (OPTICROM ) 4 % ophthalmic solution Place 1 drop into the left eye 5 (five) times daily. 10 mL 0   gabapentin (NEURONTIN) 100 MG capsule Take 100 mg by mouth daily as needed (pain.).     methocarbamol  (ROBAXIN ) 500 MG tablet Take 1 tablet (500 mg total) by mouth every 8 (eight) hours as needed for muscle spasms. 30 tablet 1   Ofatumumab (KESIMPTA) 20 MG/0.4ML SOAJ Inject 20 mg into the skin every 30 (thirty) days.     oxyCODONE  (ROXICODONE ) 5 MG immediate release tablet Take 1 tablet (5 mg total) by mouth every 8 (eight) hours as needed for severe pain (pain score 7-10). 30 tablet 0   promethazine  (PHENERGAN ) 25 MG suppository Place 1 suppository (25 mg total) rectally every 6 (six) hours as needed for nausea or vomiting. 12 each 0   propranolol ER (INDERAL LA) 80 MG 24 hr capsule Take 80 mg by mouth daily.     tadalafil  (CIALIS ) 10 MG tablet Take 1 tablet (10 mg total) by mouth every other day as needed for erectile dysfunction. 10 tablet 1   No current facility-administered medications on file prior to visit.   "

## 2024-12-02 NOTE — Telephone Encounter (Signed)
 I spoke to the patient while he was with Rosaline Bohr, NP at his appointment today.  He was interested in resources to help him manage his PTSD due to recent events in his life. Rosaline was interested in options other than [facility].  I provided them with the phone number for [facility]: [phone] and I encouraged him to call and speak with them ,

## 2024-12-16 ENCOUNTER — Ambulatory Visit (INDEPENDENT_AMBULATORY_CARE_PROVIDER_SITE_OTHER): Payer: Self-pay
